# Patient Record
Sex: Male | Born: 1960 | Race: White | Hispanic: No | Marital: Married | Smoking: Never smoker
Health system: Southern US, Community
[De-identification: ages and names within clinical notes are randomized; demographics above are authoritative.]

## PROBLEM LIST (undated history)

## (undated) DIAGNOSIS — I10 Essential (primary) hypertension: Secondary | ICD-10-CM

## (undated) DIAGNOSIS — M25519 Pain in unspecified shoulder: Secondary | ICD-10-CM

## (undated) DIAGNOSIS — Z8241 Family history of sudden cardiac death: Secondary | ICD-10-CM

## (undated) DIAGNOSIS — E291 Testicular hypofunction: Secondary | ICD-10-CM

## (undated) DIAGNOSIS — M1990 Unspecified osteoarthritis, unspecified site: Secondary | ICD-10-CM

## (undated) DIAGNOSIS — M130 Polyarthritis, unspecified: Secondary | ICD-10-CM

## (undated) HISTORY — DX: Pain in unspecified shoulder: M25.519

## (undated) HISTORY — PX: HERNIA REPAIR: SHX51

## (undated) HISTORY — DX: Testicular hypofunction: E29.1

## (undated) HISTORY — DX: Unspecified osteoarthritis, unspecified site: M19.90

## (undated) HISTORY — DX: Polyarthritis, unspecified: M13.0

## (undated) HISTORY — DX: Family history of sudden cardiac death: Z82.41

---

## 1997-12-20 ENCOUNTER — Emergency Department (HOSPITAL_COMMUNITY): Admission: EM | Admit: 1997-12-20 | Discharge: 1997-12-20 | Payer: Self-pay | Admitting: Emergency Medicine

## 2011-11-05 ENCOUNTER — Other Ambulatory Visit: Payer: Self-pay | Admitting: Specialist

## 2011-11-16 ENCOUNTER — Encounter (HOSPITAL_COMMUNITY): Payer: Self-pay

## 2011-11-16 ENCOUNTER — Encounter (HOSPITAL_COMMUNITY)
Admission: RE | Admit: 2011-11-16 | Discharge: 2011-11-16 | Disposition: A | Discharge status: Home / Self Care | Payer: Commercial Managed Care - PPO | Source: Ambulatory Visit | Attending: Specialist | Admitting: Specialist

## 2011-11-16 ENCOUNTER — Ambulatory Visit (HOSPITAL_COMMUNITY)
Admission: RE | Admit: 2011-11-16 | Discharge: 2011-11-16 | Disposition: A | Discharge status: Home / Self Care | Payer: Commercial Managed Care - PPO | Source: Ambulatory Visit | Attending: Specialist | Admitting: Specialist

## 2011-11-16 DIAGNOSIS — J9819 Other pulmonary collapse: Secondary | ICD-10-CM | POA: Insufficient documentation

## 2011-11-16 DIAGNOSIS — Z01818 Encounter for other preprocedural examination: Secondary | ICD-10-CM | POA: Insufficient documentation

## 2011-11-16 HISTORY — DX: Essential (primary) hypertension: I10

## 2011-11-16 LAB — CBC
HCT: 39.6 % (ref 39.0–52.0)
Hemoglobin: 13.4 g/dL (ref 13.0–17.0)
MCV: 95.2 fL (ref 78.0–100.0)
Platelets: 289 10*3/uL (ref 150–400)
RBC: 4.16 MIL/uL — ABNORMAL LOW (ref 4.22–5.81)
WBC: 4.2 10*3/uL (ref 4.0–10.5)

## 2011-11-16 LAB — BASIC METABOLIC PANEL
CO2: 29 mEq/L (ref 19–32)
Calcium: 9.4 mg/dL (ref 8.4–10.5)
Chloride: 99 mEq/L (ref 96–112)
Glucose, Bld: 100 mg/dL — ABNORMAL HIGH (ref 70–99)
Potassium: 4.1 mEq/L (ref 3.5–5.1)
Sodium: 136 mEq/L (ref 135–145)

## 2011-11-16 LAB — SURGICAL PCR SCREEN: Staphylococcus aureus: NEGATIVE

## 2011-11-16 NOTE — Progress Notes (Signed)
10-18-2011 ekg [facility] on chart

## 2011-11-16 NOTE — Patient Instructions (Addendum)
20 Naguan Mazon  11/16/2011   Your procedure is scheduled on:  11-25-2011  Report to [facility] a 1030  AM.  Call this number if you have problems the morning of surgery: [phone]   Remember:   Do not eat food :After Midnight.  . Clear liquids midnight until 0700 am day of surgery, then nothing by mouth  Take these medicines the morning of surgery with A SIP OF WATER: allergy pilll, flonase nasal spray.   Do not wear jewelry or make up.  Do not wear lotions, powders, or perfumes. You may wear deodorant.    Do not bring valuables to the hospital.  Contacts, dentures or bridgework may not be worn into surgery.  Leave suitcase in the car. After surgery it may be brought to your room.  For patients admitted to the hospital, checkout time is 11:00 AM the day of discharge                             Patients discharged the day of surgery will not be allowed to drive home. If going home same day of surgery, you must have someone stay with you the first 24 hours at home and arrange for some one to drive you home from hospital.    Special Instructions: See [facility] Preparing for Surgery instruction sheet. Women do not shave legs or underarms for 12 hours before showers. Men may shave face morning of surgery.    Please read over the following fact sheets that you were given: MRSA Information  Cain [facility] pre op nurse phone number [phone], call if needed

## 2011-11-25 ENCOUNTER — Ambulatory Visit (HOSPITAL_COMMUNITY): Payer: 59 | Admitting: Anesthesiology

## 2011-11-25 ENCOUNTER — Encounter (HOSPITAL_COMMUNITY): Payer: Self-pay | Admitting: *Deleted

## 2011-11-25 ENCOUNTER — Encounter (HOSPITAL_COMMUNITY): Payer: Self-pay | Admitting: Anesthesiology

## 2011-11-25 ENCOUNTER — Ambulatory Visit (HOSPITAL_COMMUNITY)
Admission: RE | Admit: 2011-11-25 | Discharge: 2011-11-26 | Disposition: A | Discharge status: Home / Self Care | Payer: 59 | Source: Ambulatory Visit | Attending: Specialist | Admitting: Specialist

## 2011-11-25 ENCOUNTER — Encounter (HOSPITAL_COMMUNITY)
Admission: RE | Disposition: A | Discharge status: Home / Self Care | Payer: Self-pay | Source: Ambulatory Visit | Attending: Specialist

## 2011-11-25 DIAGNOSIS — S43429A Sprain of unspecified rotator cuff capsule, initial encounter: Secondary | ICD-10-CM | POA: Insufficient documentation

## 2011-11-25 DIAGNOSIS — Z79899 Other long term (current) drug therapy: Secondary | ICD-10-CM | POA: Insufficient documentation

## 2011-11-25 DIAGNOSIS — M75101 Unspecified rotator cuff tear or rupture of right shoulder, not specified as traumatic: Secondary | ICD-10-CM

## 2011-11-25 DIAGNOSIS — I10 Essential (primary) hypertension: Secondary | ICD-10-CM | POA: Insufficient documentation

## 2011-11-25 DIAGNOSIS — M24119 Other articular cartilage disorders, unspecified shoulder: Secondary | ICD-10-CM | POA: Insufficient documentation

## 2011-11-25 DIAGNOSIS — X58XXXA Exposure to other specified factors, initial encounter: Secondary | ICD-10-CM | POA: Insufficient documentation

## 2011-11-25 DIAGNOSIS — M25819 Other specified joint disorders, unspecified shoulder: Secondary | ICD-10-CM | POA: Insufficient documentation

## 2011-11-25 HISTORY — PX: SHOULDER ARTHROSCOPY WITH ROTATOR CUFF REPAIR AND SUBACROMIAL DECOMPRESSION: SHX5686

## 2011-11-25 HISTORY — DX: Unspecified rotator cuff tear or rupture of right shoulder, not specified as traumatic: M75.101

## 2011-11-25 SURGERY — SHOULDER ARTHROSCOPY WITH ROTATOR CUFF REPAIR AND SUBACROMIAL DECOMPRESSION
Anesthesia: General | Site: Shoulder | Laterality: Right | Wound class: Clean

## 2011-11-25 MED ORDER — GLYCOPYRROLATE 0.2 MG/ML IJ SOLN
INTRAMUSCULAR | Status: DC | PRN
  Administered 2011-11-25: 0.6 mg via INTRAVENOUS

## 2011-11-25 MED ORDER — LACTATED RINGERS IV SOLN: INTRAVENOUS | Status: DC

## 2011-11-25 MED ORDER — DEXAMETHASONE SODIUM PHOSPHATE 4 MG/ML IJ SOLN
INTRAMUSCULAR | Status: DC | PRN
  Administered 2011-11-25: 10 mg via INTRAVENOUS

## 2011-11-25 MED ORDER — ACETAMINOPHEN 10 MG/ML IV SOLN
INTRAVENOUS | Status: AC
  Filled 2011-11-25: qty 100

## 2011-11-25 MED ORDER — METOCLOPRAMIDE HCL 5 MG/ML IJ SOLN: 5.0000 mg | Freq: Three times a day (TID) | INTRAMUSCULAR | Status: DC | PRN

## 2011-11-25 MED ORDER — METHOCARBAMOL 500 MG PO TABS
500.0000 mg | ORAL_TABLET | Freq: Four times a day (QID) | ORAL | Status: DC | PRN
  Administered 2011-11-25: 500 mg via ORAL
  Filled 2011-11-25: qty 1

## 2011-11-25 MED ORDER — METOCLOPRAMIDE HCL 5 MG/ML IJ SOLN
INTRAMUSCULAR | Status: DC | PRN
  Administered 2011-11-25: 10 mg via INTRAVENOUS

## 2011-11-25 MED ORDER — KETOROLAC TROMETHAMINE 30 MG/ML IJ SOLN
INTRAMUSCULAR | Status: AC
  Administered 2011-11-25: 30 mg via INTRAVENOUS
  Filled 2011-11-25: qty 1

## 2011-11-25 MED ORDER — LACTATED RINGERS IR SOLN
Status: DC | PRN
  Administered 2011-11-25: 6000 mL

## 2011-11-25 MED ORDER — HYDROMORPHONE HCL PF 1 MG/ML IJ SOLN
0.2500 mg | INTRAMUSCULAR | Status: DC | PRN
  Administered 2011-11-25 (×4): 0.5 mg via INTRAVENOUS

## 2011-11-25 MED ORDER — KETAMINE HCL 10 MG/ML IJ SOLN
INTRAMUSCULAR | Status: DC | PRN
  Administered 2011-11-25 (×2): 1 mg via INTRAVENOUS
  Administered 2011-11-25: 45 mg via INTRAVENOUS
  Administered 2011-11-25 (×5): 1 mg via INTRAVENOUS

## 2011-11-25 MED ORDER — PHENOL 1.4 % MT LIQD: 1.0000 | OROMUCOSAL | Status: DC | PRN

## 2011-11-25 MED ORDER — DOCUSATE SODIUM 100 MG PO CAPS
100.0000 mg | ORAL_CAPSULE | Freq: Two times a day (BID) | ORAL | Status: DC
  Administered 2011-11-25: 100 mg via ORAL

## 2011-11-25 MED ORDER — EPINEPHRINE HCL 1 MG/ML IJ SOLN
INTRAMUSCULAR | Status: DC | PRN
  Administered 2011-11-25: 2 mg

## 2011-11-25 MED ORDER — HYDROMORPHONE HCL PF 1 MG/ML IJ SOLN
INTRAMUSCULAR | Status: AC
  Filled 2011-11-25: qty 1

## 2011-11-25 MED ORDER — NEOSTIGMINE METHYLSULFATE 1 MG/ML IJ SOLN
INTRAMUSCULAR | Status: DC | PRN
  Administered 2011-11-25: 4 mg via INTRAVENOUS

## 2011-11-25 MED ORDER — ACETAMINOPHEN 650 MG RE SUPP: 650.0000 mg | Freq: Four times a day (QID) | RECTAL | Status: DC | PRN

## 2011-11-25 MED ORDER — EPINEPHRINE HCL 1 MG/ML IJ SOLN
INTRAMUSCULAR | Status: AC
  Filled 2011-11-25: qty 2

## 2011-11-25 MED ORDER — SODIUM CHLORIDE 0.9 % IR SOLN
Status: DC | PRN
  Administered 2011-11-25: 15:00:00

## 2011-11-25 MED ORDER — BUPIVACAINE-EPINEPHRINE 0.5% -1:200000 IJ SOLN
INTRAMUSCULAR | Status: DC | PRN
  Administered 2011-11-25: 20 mL

## 2011-11-25 MED ORDER — ACETAMINOPHEN 325 MG PO TABS
650.0000 mg | ORAL_TABLET | Freq: Four times a day (QID) | ORAL | Status: DC | PRN
  Administered 2011-11-26: 650 mg via ORAL
  Filled 2011-11-25: qty 2

## 2011-11-25 MED ORDER — MIDAZOLAM HCL 5 MG/5ML IJ SOLN
INTRAMUSCULAR | Status: DC | PRN
  Administered 2011-11-25: 2 mg via INTRAVENOUS

## 2011-11-25 MED ORDER — ACETAMINOPHEN 10 MG/ML IV SOLN
INTRAVENOUS | Status: DC | PRN
  Administered 2011-11-25: 1000 mg via INTRAVENOUS

## 2011-11-25 MED ORDER — OXYCODONE-ACETAMINOPHEN 7.5-325 MG PO TABS: 1.0000 | ORAL_TABLET | ORAL | Status: DC | PRN

## 2011-11-25 MED ORDER — ONDANSETRON HCL 4 MG PO TABS: 4.0000 mg | ORAL_TABLET | Freq: Four times a day (QID) | ORAL | Status: DC | PRN

## 2011-11-25 MED ORDER — ONDANSETRON HCL 4 MG/2ML IJ SOLN: 4.0000 mg | Freq: Four times a day (QID) | INTRAMUSCULAR | Status: DC | PRN

## 2011-11-25 MED ORDER — CEFAZOLIN SODIUM-DEXTROSE 2-3 GM-% IV SOLR
INTRAVENOUS | Status: AC
  Filled 2011-11-25: qty 50

## 2011-11-25 MED ORDER — CEFAZOLIN SODIUM-DEXTROSE 2-3 GM-% IV SOLR
2.0000 g | INTRAVENOUS | Status: AC
  Administered 2011-11-25: 2 g via INTRAVENOUS

## 2011-11-25 MED ORDER — PROMETHAZINE HCL 25 MG/ML IJ SOLN: 6.2500 mg | INTRAMUSCULAR | Status: DC | PRN

## 2011-11-25 MED ORDER — PROMETHAZINE HCL 25 MG/ML IJ SOLN
INTRAMUSCULAR | Status: AC
  Filled 2011-11-25: qty 1

## 2011-11-25 MED ORDER — FENTANYL CITRATE 0.05 MG/ML IJ SOLN
INTRAMUSCULAR | Status: DC | PRN
  Administered 2011-11-25: 50 ug via INTRAVENOUS
  Administered 2011-11-25 (×2): 100 ug via INTRAVENOUS
  Administered 2011-11-25 (×3): 50 ug via INTRAVENOUS

## 2011-11-25 MED ORDER — KETOROLAC TROMETHAMINE 30 MG/ML IJ SOLN: 30.0000 mg | Freq: Once | INTRAMUSCULAR | Status: DC

## 2011-11-25 MED ORDER — ONDANSETRON HCL 4 MG/2ML IJ SOLN
INTRAMUSCULAR | Status: DC | PRN
  Administered 2011-11-25: 4 mg via INTRAVENOUS

## 2011-11-25 MED ORDER — ROCURONIUM BROMIDE 100 MG/10ML IV SOLN
INTRAVENOUS | Status: DC | PRN
  Administered 2011-11-25: 50 mg via INTRAVENOUS

## 2011-11-25 MED ORDER — MENTHOL 3 MG MT LOZG: 1.0000 | LOZENGE | OROMUCOSAL | Status: DC | PRN

## 2011-11-25 MED ORDER — METOCLOPRAMIDE HCL 10 MG PO TABS: 5.0000 mg | ORAL_TABLET | Freq: Three times a day (TID) | ORAL | Status: DC | PRN

## 2011-11-25 MED ORDER — SODIUM CHLORIDE 0.45 % IV SOLN
INTRAVENOUS | Status: DC
  Administered 2011-11-25: 19:00:00 via INTRAVENOUS

## 2011-11-25 MED ORDER — OXYCODONE-ACETAMINOPHEN 5-325 MG PO TABS
1.0000 | ORAL_TABLET | ORAL | Status: DC | PRN
  Administered 2011-11-25 (×2): 1 via ORAL
  Filled 2011-11-25: qty 2
  Filled 2011-11-25 (×2): qty 1

## 2011-11-25 MED ORDER — KETOROLAC TROMETHAMINE 10 MG PO TABS: 10.0000 mg | ORAL_TABLET | Freq: Four times a day (QID) | ORAL | Status: DC | PRN

## 2011-11-25 MED ORDER — CEPHALEXIN 500 MG PO CAPS: 500.0000 mg | ORAL_CAPSULE | Freq: Four times a day (QID) | ORAL | Status: DC

## 2011-11-25 MED ORDER — METHOCARBAMOL 500 MG PO TABS: 500.0000 mg | ORAL_TABLET | Freq: Three times a day (TID) | ORAL | Status: DC | PRN

## 2011-11-25 MED ORDER — METHOCARBAMOL 100 MG/ML IJ SOLN
500.0000 mg | Freq: Four times a day (QID) | INTRAMUSCULAR | Status: DC | PRN
  Administered 2011-11-25: 500 mg via INTRAVENOUS
  Filled 2011-11-25: qty 5

## 2011-11-25 MED ORDER — PROPOFOL 10 MG/ML IV BOLUS
INTRAVENOUS | Status: DC | PRN
  Administered 2011-11-25: 200 mg via INTRAVENOUS

## 2011-11-25 MED ORDER — PHENYLEPHRINE HCL 10 MG/ML IJ SOLN
INTRAMUSCULAR | Status: DC | PRN
  Administered 2011-11-25: 40 ug via INTRAVENOUS

## 2011-11-25 MED ORDER — HYDROMORPHONE HCL PF 1 MG/ML IJ SOLN: 0.5000 mg | INTRAMUSCULAR | Status: DC | PRN

## 2011-11-25 MED ORDER — BUPIVACAINE-EPINEPHRINE (PF) 0.5% -1:200000 IJ SOLN
INTRAMUSCULAR | Status: AC
  Filled 2011-11-25: qty 10

## 2011-11-25 MED ORDER — LACTATED RINGERS IV SOLN
INTRAVENOUS | Status: DC | PRN
  Administered 2011-11-25 (×2): via INTRAVENOUS

## 2011-11-25 SURGICAL SUPPLY — 40 items
ANCH SUT 2 CP-2 ROTR CUF (Anchor) ×1 IMPLANT
ANCH SUT PUSHLCK 24X4.5 STRL (Orthopedic Implant) ×1 IMPLANT
ANCHOR ROTATOR CUFF #2 (Anchor) ×2 IMPLANT
BLADE CUTTER GATOR 3.5 (BLADE) ×2 IMPLANT
BLADE SURG SZ11 CARB STEEL (BLADE) ×2 IMPLANT
BUR OVAL 4.0 (BURR) ×2 IMPLANT
CANNULA ACUFO 5X76 (CANNULA) ×1 IMPLANT
CHLORAPREP W/TINT 26ML (MISCELLANEOUS) IMPLANT
CLOTH BEACON ORANGE TIMEOUT ST (SAFETY) ×2 IMPLANT
DECANTER SPIKE VIAL GLASS SM (MISCELLANEOUS) ×2 IMPLANT
DRAPE ORTHO SPLIT 77X108 STRL (DRAPES) ×2
DRAPE SURG ORHT 6 SPLT 77X108 (DRAPES) IMPLANT
DRAPE U-SHAPE 47X51 STRL (DRAPES) ×2 IMPLANT
DRSG EMULSION OIL 3X3 NADH (GAUZE/BANDAGES/DRESSINGS) ×1 IMPLANT
DURAPREP 26ML APPLICATOR (WOUND CARE) ×2 IMPLANT
GLOVE BIO SURGEON STRL SZ 6.5 (GLOVE) ×2 IMPLANT
GLOVE BIOGEL PI IND STRL 8 (GLOVE) ×1 IMPLANT
GLOVE BIOGEL PI INDICATOR 8 (GLOVE) ×1
GLOVE ECLIPSE 6.5 STRL STRAW (GLOVE) ×2 IMPLANT
GLOVE SURG SS PI 8.0 STRL IVOR (GLOVE) ×4 IMPLANT
GOWN PREVENTION PLUS LG XLONG (DISPOSABLE) ×1 IMPLANT
GOWN PREVENTION PLUS XLARGE (GOWN DISPOSABLE) ×4 IMPLANT
GOWN STRL REIN XL XLG (GOWN DISPOSABLE) ×1 IMPLANT
KIT BASIN OR (CUSTOM PROCEDURE TRAY) ×2 IMPLANT
MANIFOLD NEPTUNE II (INSTRUMENTS) ×3 IMPLANT
NDL SPNL 18GX3.5 QUINCKE PK (NEEDLE) ×1 IMPLANT
NEEDLE SPNL 18GX3.5 QUINCKE PK (NEEDLE) ×2 IMPLANT
PACK SHOULDER CUSTOM OPM052 (CUSTOM PROCEDURE TRAY) ×2 IMPLANT
POSITIONER SURGICAL ARM (MISCELLANEOUS) ×1 IMPLANT
PUSHLOCK PEEK 4.5X24 (Orthopedic Implant) ×2 IMPLANT
SET ARTHROSCOPY TUBING (MISCELLANEOUS) ×2
SET ARTHROSCOPY TUBING LN (MISCELLANEOUS) ×1 IMPLANT
SLING ARM IMMOBILIZER LRG (SOFTGOODS) ×1 IMPLANT
SLING ARM IMMOBILIZER MED (SOFTGOODS) ×1 IMPLANT
STRAP CHIN BCCS-OSI (MISCELLANEOUS) ×2 IMPLANT
SUT ETHILON 4 0 PS 2 18 (SUTURE) ×1 IMPLANT
SUT VIC AB 2-0 CT2 27 (SUTURE) ×1 IMPLANT
SUT VICRYL 0-0 OS 2 NEEDLE (SUTURE) ×2 IMPLANT
TUBING CONNECTING 10 (TUBING) ×2 IMPLANT
WAND 90 DEG TURBOVAC W/CORD (SURGICAL WAND) ×1 IMPLANT

## 2011-11-25 NOTE — Anesthesia Postprocedure Evaluation (Deleted)
  Anesthesia Post-op Note  Patient: Dustin Mclean  Procedure(s) Performed: Procedure(s) (LRB): SHOULDER ARTHROSCOPY WITH ROTATOR CUFF REPAIR AND SUBACROMIAL DECOMPRESSION (Right)  Patient Location: PACU  Anesthesia Type: General  Level of Consciousness: awake and alert   Airway and Oxygen Therapy: Patient Spontanous Breathing  Post-op Pain: mild  Post-op Assessment: Post-op Vital signs reviewed, Patient's Cardiovascular Status Stable, Respiratory Function Stable, Patent Airway and No signs of Nausea or vomiting  Post-op Vital Signs: stable  Complications: No apparent anesthesia complications

## 2011-11-25 NOTE — Transfer of Care (Signed)
Immediate Anesthesia Transfer of Care Note  Patient: Dustin Mclean  Procedure(s) Performed: Procedure(s) (LRB) with comments: SHOULDER ARTHROSCOPY WITH ROTATOR CUFF REPAIR AND SUBACROMIAL DECOMPRESSION (Right) - RIGHT SHOULDER ARTHROSCOPY WITH ROTATOR CUFF REPAIR/SUBACROMIAL DECOMPRESSION WITH MINI OPEN ROTATOR CUFF REPAIR  Patient Location: PACU  Anesthesia Type:General  Level of Consciousness: awake, alert , patient cooperative and responds to stimulation  Airway & Oxygen Therapy: Patient Spontanous Breathing and Patient connected to face mask oxygen  Post-op Assessment: Report given to PACU RN, Post -op Vital signs reviewed and stable and Patient moving all extremities X 4  Post vital signs: Reviewed and stable  Complications: No apparent anesthesia complications

## 2011-11-25 NOTE — Brief Op Note (Signed)
11/25/2011  3:02 PM  PATIENT:  Dustin Mclean  51 y.o. male  PRE-OPERATIVE DIAGNOSIS:  RIGHT SHOULDER ROTATOR CUFF TEAR  POST-OPERATIVE DIAGNOSIS:  repair right rotator cuff  PROCEDURE:  Procedure(s) (LRB) with comments: SHOULDER ARTHROSCOPY WITH ROTATOR CUFF REPAIR AND SUBACROMIAL DECOMPRESSION (Right) - RIGHT SHOULDER ARTHROSCOPY WITH ROTATOR CUFF REPAIR/SUBACROMIAL DECOMPRESSION WITH MINI OPEN ROTATOR CUFF REPAIR  SURGEON:  Surgeon(s) and Role:    * Javier Docker, MD - Primary  PHYSICIAN ASSISTANT:   ASSISTANTS: Irish Elders   ANESTHESIA:   general  EBL:  Total I/O In: 1000 [I.V.:1000] Out: -   BLOOD ADMINISTERED:none  DRAINS: none   LOCAL MEDICATIONS USED:  MARCAINE     SPECIMEN:  No Specimen  DISPOSITION OF SPECIMEN:  N/A  COUNTS:  YES  TOURNIQUET:  * No tourniquets in log *  DICTATION: .Other Dictation: Dictation Number     [id number]  PLAN OF CARE: Discharge to home after PACU  PATIENT DISPOSITION:  PACU - hemodynamically stable.   Delay start of Pharmacological VTE agent (>24hrs) due to surgical blood loss or risk of bleeding: no

## 2011-11-25 NOTE — Anesthesia Postprocedure Evaluation (Signed)
  Anesthesia Post-op Note  Patient: Dustin Mclean  Procedure(s) Performed: Procedure(s) (LRB): SHOULDER ARTHROSCOPY WITH ROTATOR CUFF REPAIR AND SUBACROMIAL DECOMPRESSION (Right)  Patient Location: PACU  Anesthesia Type: General  Level of Consciousness: awake and alert   Airway and Oxygen Therapy: Patient Spontanous Breathing  Post-op Pain: mild  Post-op Assessment: Post-op Vital signs reviewed, Patient's Cardiovascular Status Stable, Respiratory Function Stable, Patent Airway and No signs of Nausea or vomiting  Post-op Vital Signs: stable  Complications: No apparent anesthesia complications. To be admitted.

## 2011-11-25 NOTE — H&P (Signed)
Dustin Mclean is an 51 y.o. male.   Chief Complaint: right RC tear HPI: Tear RC right  Past Medical History  Diagnosis Date  . Hypertension     Past Surgical History  Procedure Date  . Hernia repair 2006 and yrs ago    x 2    History reviewed. No pertinent family history. Social History:  reports that he has never smoked. He has never used smokeless tobacco. He reports that he does not drink alcohol or use illicit drugs.  Allergies:  Allergies  Allergen Reactions  . Caffeine Other (See Comments)    Loose bowel movements    Medications Prior to Admission  Medication Sig Dispense Refill  . cetirizine (ZYRTEC) 10 MG tablet Take 10 mg by mouth every morning.       . diclofenac (VOLTAREN) 75 MG EC tablet Take 75 mg by mouth 2 (two) times daily as needed. Pain      . fluticasone (FLONASE) 50 MCG/ACT nasal spray Place 2 sprays into the nose every morning.       Marland Kitchen lisinopril (PRINIVIL,ZESTRIL) 10 MG tablet Take 15 mg by mouth every morning. Pt takes 1.5 tab for 15 mg dose      . Multiple Vitamins-Minerals (MULTIVITAMIN WITH MINERALS) tablet Take 1 tablet by mouth daily.      . Omega-3 Fatty Acids (FISH OIL) 1000 MG CAPS Take 1,000 mg by mouth 2 (two) times daily.        No results found for this or any previous visit (from the past 48 hour(s)). No results found.  Review of Systems  Musculoskeletal: Positive for joint pain.  All other systems reviewed and are negative.    Blood pressure 150/93, pulse 74, temperature 98.4 F (36.9 C), temperature source Oral, resp. rate 18, SpO2 97.00%. Physical Exam  Vitals reviewed. Constitutional: He is oriented to person, place, and time. He appears well-developed.  HENT:  Head: Normocephalic.  Eyes: Pupils are equal, round, and reactive to light.  Neck: Normal range of motion.  Cardiovascular: Normal rate.   Respiratory: Effort normal.  GI: Soft.  Musculoskeletal:       +impingement right. Weak ER.  Neurological: He is alert and  oriented to person, place, and time.  Skin: Skin is warm and dry.  Psychiatric: He has a normal mood and affect.   MRI tear right RC  Assessment/Plan Right RC tear. Plan Right SA SAD RCR. Risks discussed  Joven Mom C 11/25/2011, 12:59 PM

## 2011-11-25 NOTE — Preoperative (Signed)
Beta Blockers   Reason not to administer Beta Blockers:Not Applicable, not on home BB 

## 2011-11-25 NOTE — Anesthesia Preprocedure Evaluation (Addendum)
Anesthesia Evaluation  Patient identified by MRN, date of birth, ID band Patient awake  General Assessment Comment:H/O difficulty breathing after surgery in 2006 in PACU, but this resolved.  Reviewed: Allergy & Precautions, H&P , NPO status , Patient's Chart, lab work & pertinent test results, reviewed documented beta blocker date and time   Airway Mallampati: II TM Distance: >3 FB Neck ROM: full    Dental No notable dental hx.    Pulmonary neg pulmonary ROS,  breath sounds clear to auscultation  Pulmonary exam normal       Cardiovascular Exercise Tolerance: Good hypertension, On Medications Rhythm:regular Rate:Normal     Neuro/Psych negative neurological ROS  negative psych ROS   GI/Hepatic negative GI ROS, Neg liver ROS,   Endo/Other  negative endocrine ROS  Renal/GU negative Renal ROS  negative genitourinary   Musculoskeletal   Abdominal (+) + obese,   Peds  Hematology negative hematology ROS (+)   Anesthesia Other Findings   Reproductive/Obstetrics negative OB ROS                          Anesthesia Physical Anesthesia Plan  ASA: II  Anesthesia Plan: General ETT   Post-op Pain Management:    Induction:   Airway Management Planned:   Additional Equipment:   Intra-op Plan:   Post-operative Plan:   Informed Consent: I have reviewed the patients History and Physical, chart, labs and discussed the procedure including the risks, benefits and alternatives for the proposed anesthesia with the patient or authorized representative who has indicated his/her understanding and acceptance.   Dental Advisory Given  Plan Discussed with: CRNA  Anesthesia Plan Comments:         Anesthesia Quick Evaluation

## 2011-11-25 NOTE — Anesthesia Procedure Notes (Signed)
Procedure Name: Intubation Date/Time: 11/25/2011 1:26 PM Performed by: Randon Goldsmith CATHERINE PAYNE Pre-anesthesia Checklist: Patient identified, Emergency Drugs available, Suction available and Patient being monitored Patient Re-evaluated:Patient Re-evaluated prior to inductionOxygen Delivery Method: Circle system utilized Preoxygenation: Pre-oxygenation with 100% oxygen Intubation Type: IV induction Ventilation: Mask ventilation without difficulty Grade View: Grade I Tube type: Oral Tube size: 7.5 mm Number of attempts: 2 Airway Equipment and Method: Video-laryngoscopy Placement Confirmation: ETT inserted through vocal cords under direct vision,  positive ETCO2 and breath sounds checked- equal and bilateral Secured at: 24 cm Tube secured with: Tape Dental Injury: Injury to tongue, Bloody posterior oropharynx and Injury to lip  Difficulty Due To: Difficulty was unanticipated, Difficult Airway- due to limited oral opening and Difficult Airway- due to reduced neck mobility Comments: DVL x1 with mac 4, only view epiglottis, unable to pass ETT through cords, DVL x1 with glidescope, cords in view, ETT passed trhough cords under direct visualization. Weston Brass noted to upper left side of lip and nick noted to left side of tongue , and blood noted in the posterior oropharynx, Dr. Council Mechanic aware

## 2011-11-26 NOTE — Progress Notes (Signed)
Discharged from floor via w/c, spouse with pt. No changes in assessment. [facility]   

## 2011-11-26 NOTE — Op Note (Signed)
NAMETRISTIN, VANDEUSEN NO.:  [id number]  MEDICAL RECORD NO.:  [id number]  LOCATION:  1612                         FACILITY:  [facility]  PHYSICIAN:  Jene Every, M.D.    DATE OF BIRTH:  04-01-1960  DATE OF PROCEDURE: DATE OF DISCHARGE:                              OPERATIVE REPORT   PREOPERATIVE DIAGNOSIS:  Rotator cuff tear impingement syndrome, right shoulder.  POSTOPERATIVE DIAGNOSIS:  Rotator cuff tear impingement syndrome, right shoulder, glenoid labral tear.  PROCEDURE PERFORMED: 1. Right shoulder arthroscopy. 2. Debridement of anterior and superior labral tear. 3. Subacromial decompression acromioplasty. 4. Mini open rotator cuff repair.  ANESTHESIA:  General.  ASSISTANT:  Lanna Poche, PA  HISTORY:  A 51 year old fell with rotator cuff tear by MRI, impingement syndrome, possible labral pathology indicated for repair due to persistent symptoms.  Also, has impingement pain despite conservative treatment.  There is a full-thickness tear, is indicated for repair. Risks and benefits discussed including bleeding, infection, DVT, PE, anesthetic complications, etc.  TECHNIQUE:  The patient in supine beach-chair position after induction of adequate general anesthesia, 2 g Kefzol.  Exam under anesthesia revealed full range, right shoulder and upper extremity.  It was prepped and draped in the usual sterile fashion.  Surgical marker utilized on the acromion, AC joint, and coracoid.  Standard posterolateral and anterolateral portals were utilized with incision through the skin only. With the arm in 7:30 position with gentle traction applied, we advanced the arthroscopic camera in the glenohumeral joint in line with coracoid penetrating atraumatically.  Irrigant was utilized to insufflate the joint, 65 mmHg were utilized.  There was extensive tearing of the anterior superior labrum.  The glenoid and humerus was unremarkable. Biceps tendon was in its  groove.  Introduced an 18-gauge needle, was utilized to localize the anterior portal just beneath the biceps tendon, made a small incision, advanced the cannula and penetrating the capsule just beneath the biceps tendon.  Introducer shaver debrided the labrum to a stable base.  There was no detachment from the glenoid.  The joint was lavaged as well.  This was then removed.  Further inspection revealed, subscap was unremarkable.  Small area of tearing was noted just lateral to the bicipital groove and the supraspinatus.  I redirected the camera in the subacromial space and fashioned the anterolateral portal.  The skin only with a #11 blade.  Ingress cannula triangulated in the subacromial space.  Exuberant bursa was noted.  This was shaved, released the CA ligament.  A small tear was noted.  We had issues with the arthroscopic camera with suboptimal visualization. Therefore, converted to a suboptimal visualization.  Next, we converted to a mini open repair.  We removed all arthroscopic and cannula was closed with the portals with 4-0 nylon simple sutures.  I made a small 2- cm incision over the anterolateral aspect of the acromion.  Subcutaneous tissue was dissected.  Electrocautery was utilized to achieve hemostasis.  Raphe between the anterolateral heads was identified, developed, divided in line with skin incision.  Using an AO elevator into the subacromial space, anterolateral and anteromedial, preserving the deltoid attachment.  Removing the remainder of the CA ligament. Small spur  over the anterolateral aspect the acromion, and anteromedial aspect of the acromion was removed with a 3-mm Kerrison.  I digitally lysed adhesions in the subacromial space.  Small tear was noted.  Full thickness was only 3 mm and 4 mm in length.  Debrided the edges and repaired it side-to-side with 1-0 Vicryl interrupted figure-of-eight sutures.  Remainder of the cuff was unremarkable.  Therefore,  we copiously irrigated the wound.  We repaired the raphe with 1 Vicryl interrupted figure-of-eight sutures, subcu with 2-0 Vicryl simple sutures.  Skin was reapproximated 4-0 subcuticular Prolene.  Wound reinforced with Steri-Strips.  Sterile dressing applied.  Placed in a sling, extubated without difficulty, and transported to the recovery room in satisfactory condition.  The patient tolerated the procedure well.  No complications.  Assistant was Lanna Poche, [place].  Minimal blood loss.     Jene Every, M.D.     Cordelia Pen  D:  11/25/2011  T:  11/26/2011  Job:  [id number]

## 2011-11-26 NOTE — Progress Notes (Signed)
Orthopedics Progress Note  Subjective: Pt doing well Minimal pain to right shoulder today Ready for d/c  Objective:  Filed Vitals:   11/26/11 0540  BP: 122/72  Pulse: 69  Temp: 98.7 F (37.1 C)  Resp: 18    General: Awake and alert  Musculoskeletal: right shoulder dressing and sling intact, nv intact distally Neurovascularly intact  Lab Results  Component Value Date   WBC 4.2 11/16/2011   HGB 13.4 11/16/2011   HCT 39.6 11/16/2011   MCV 95.2 11/16/2011   PLT 289 11/16/2011       Component Value Date/Time   NA 136 11/16/2011 1400   K 4.1 11/16/2011 1400   CL 99 11/16/2011 1400   CO2 29 11/16/2011 1400   GLUCOSE 100* 11/16/2011 1400   BUN 16 11/16/2011 1400   CREATININE 0.83 11/16/2011 1400   CALCIUM 9.4 11/16/2011 1400   GFRNONAA >90 11/16/2011 1400   GFRAA >90 11/16/2011 1400    No results found for this basename: INR, PROTIME    Assessment/Plan: POD #1 s/p Procedure(s):right  SHOULDER ARTHROSCOPY WITH ROTATOR CUFF REPAIR AND SUBACROMIAL DECOMPRESSION  D/c home  F/u in 2 weeks  Almedia Balls. Ranell Patrick, MD 11/26/2011 7:46 AM

## 2011-11-28 ENCOUNTER — Encounter (HOSPITAL_COMMUNITY): Payer: Self-pay | Admitting: Specialist

## 2011-11-30 NOTE — Discharge Summary (Signed)
Physician Discharge Summary  Patient ID: Dustin Mclean MRN: [id number] DOB/AGE: 11-Aug-1960 51 y.o.  Admit date: 11/25/2011 Discharge date: 11/30/2011  Admission Diagnoses: rotator cuf tear right  Discharge Diagnoses:  Principal Problem:  *Rotator cuff tear, right   Discharged Condition: good  Hospital Course: tolerated well. No complications  Consults: None  Significant Diagnostic Studies: none  Treatments: surgery: RC repair. Tolerated well.   Discharge Exam: Blood pressure 122/72, pulse 69, temperature 98.7 F (37.1 C), temperature source Oral, resp. rate 18, height 5\' 10"  (1.778 m), weight 106.595 kg (235 lb), SpO2 94.00%. See notes  Disposition: 01-Home or Self Care  Discharge Orders    Future Orders Please Complete By Expires   Diet - low sodium heart healthy      Diet - low sodium heart healthy      Call MD / Call 911      Comments:   If you experience chest pain or shortness of breath, CALL 911 and be transported to the hospital emergency room.  If you develope a fever above 101 F, pus (white drainage) or increased drainage or redness at the wound, or calf pain, call your surgeon's office.   Constipation Prevention      Comments:   Drink plenty of fluids.  Prune juice may be helpful.  You may use a stool softener, such as Colace (over the counter) 100 mg twice a day.  Use MiraLax (over the counter) for constipation as needed.   Increase activity slowly as tolerated      Call MD / Call 911      Comments:   If you experience chest pain or shortness of breath, CALL 911 and be transported to the hospital emergency room.  If you develope a fever above 101 F, pus (white drainage) or increased drainage or redness at the wound, or calf pain, call your surgeon's office.   Constipation Prevention      Comments:   Drink plenty of fluids.  Prune juice may be helpful.  You may use a stool softener, such as Colace (over the counter) 100 mg twice a day.  Use MiraLax (over the  counter) for constipation as needed.   Increase activity slowly as tolerated          Medication List     As of 11/30/2011  8:40 AM    STOP taking these medications         diclofenac 75 MG EC tablet   Commonly known as: VOLTAREN      TAKE these medications         cephALEXin 500 MG capsule   Commonly known as: KEFLEX   Take 1 capsule (500 mg total) by mouth 4 (four) times daily.      cetirizine 10 MG tablet   Commonly known as: ZYRTEC   Take 10 mg by mouth every morning.      Fish Oil 1000 MG Caps   Take 1,000 mg by mouth 2 (two) times daily.      fluticasone 50 MCG/ACT nasal spray   Commonly known as: FLONASE   Place 2 sprays into the nose every morning.      ketorolac 10 MG tablet   Commonly known as: TORADOL   Take 1 tablet (10 mg total) by mouth every 6 (six) hours as needed for pain.      lisinopril 10 MG tablet   Commonly known as: PRINIVIL,ZESTRIL   Take 15 mg by mouth every morning. Pt takes 1.5 tab for  15 mg dose      methocarbamol 500 MG tablet   Commonly known as: ROBAXIN   Take 1 tablet (500 mg total) by mouth 3 (three) times daily between meals as needed.      multivitamin with minerals tablet   Take 1 tablet by mouth daily.      oxyCODONE-acetaminophen 7.5-325 MG per tablet   Commonly known as: PERCOCET   Take 1-2 tablets by mouth every 4 (four) hours as needed for pain.           Follow-up Information    Follow up with Tippi Mccrae C, MD. In 2 weeks.   Contact information:   [address] [place] [address] [phone]          Signed: Javier Docker 11/30/2011, 8:40 AM

## 2013-05-10 HISTORY — PX: COLONOSCOPY: SHX174

## 2019-11-25 DIAGNOSIS — I10 Essential (primary) hypertension: Secondary | ICD-10-CM | POA: Insufficient documentation

## 2019-11-25 DIAGNOSIS — Z8241 Family history of sudden cardiac death: Secondary | ICD-10-CM | POA: Insufficient documentation

## 2019-11-25 DIAGNOSIS — M1990 Unspecified osteoarthritis, unspecified site: Secondary | ICD-10-CM | POA: Insufficient documentation

## 2019-11-25 DIAGNOSIS — M130 Polyarthritis, unspecified: Secondary | ICD-10-CM | POA: Insufficient documentation

## 2019-11-25 DIAGNOSIS — M25519 Pain in unspecified shoulder: Secondary | ICD-10-CM | POA: Insufficient documentation

## 2019-11-25 DIAGNOSIS — E291 Testicular hypofunction: Secondary | ICD-10-CM | POA: Insufficient documentation

## 2019-11-25 DIAGNOSIS — Z8249 Family history of ischemic heart disease and other diseases of the circulatory system: Secondary | ICD-10-CM | POA: Insufficient documentation

## 2019-11-26 ENCOUNTER — Encounter: Payer: Self-pay | Admitting: Cardiology

## 2019-11-26 ENCOUNTER — Other Ambulatory Visit: Payer: Self-pay

## 2019-11-26 ENCOUNTER — Ambulatory Visit (INDEPENDENT_AMBULATORY_CARE_PROVIDER_SITE_OTHER): Payer: 59 | Admitting: Cardiology

## 2019-11-26 VITALS — BP 122/88 | HR 71 | Ht 70.0 in | Wt 215.0 lb

## 2019-11-26 DIAGNOSIS — I10 Essential (primary) hypertension: Secondary | ICD-10-CM | POA: Diagnosis not present

## 2019-11-26 DIAGNOSIS — Z8249 Family history of ischemic heart disease and other diseases of the circulatory system: Secondary | ICD-10-CM | POA: Diagnosis not present

## 2019-11-26 DIAGNOSIS — R0789 Other chest pain: Secondary | ICD-10-CM | POA: Diagnosis not present

## 2019-11-26 MED ORDER — METOPROLOL TARTRATE 100 MG PO TABS: ORAL_TABLET | ORAL | 0 refills | Status: DC

## 2019-11-26 NOTE — Progress Notes (Signed)
Cardiology Office Note:    Date:  11/27/2019   ID:  Dustin Mclean, DOB 02/18/1960, MRN [id number]  PCP:  Imagene Riches, NP  Cardiologist:  Berniece Salines, DO  Electrophysiologist:  None   Referring MD: Imagene Riches, NP   " I have been having chest pain"  History of Present Illness:    Dustin Mclean is a 59 y.o. male with a hx of hypertension, family history of premature coronary artery disease, hyperlipidemia is here today to establish cardiac care.  The patient tells me that he had been experiencing significant intermittent chest pain.  He notes that his midsternal is a discomfort pressure-like sensation.  He also tells me that he feels as if the pain starts in his chest and the pressure pushes out towards his back area.  He is concerned about this he has not had any shortness of breath.  But noting that his brother died at age 8 and his father first MI was in his 36s.  Past Medical History:  Diagnosis Date  . Arthritis   . Family history of sudden cardiac death (SCD)   . Hypertension   . Pain in unspecified shoulder   . Polyarthritis, unspecified   . Testicular hypofunction     Past Surgical History:  Procedure Laterality Date  . COLONOSCOPY  05/10/2013  . HERNIA REPAIR  2006 and yrs ago   x 2  . SHOULDER ARTHROSCOPY WITH ROTATOR CUFF REPAIR AND SUBACROMIAL DECOMPRESSION  11/25/2011   Procedure: SHOULDER ARTHROSCOPY WITH ROTATOR CUFF REPAIR AND SUBACROMIAL DECOMPRESSION;  Surgeon: Johnn Hai, MD;  Location: [facility];  Service: Orthopedics;  Laterality: Right;  RIGHT SHOULDER ARTHROSCOPY WITH ROTATOR CUFF REPAIR/SUBACROMIAL DECOMPRESSION WITH MINI OPEN ROTATOR CUFF REPAIR    Current Medications: Current Meds  Medication Sig  . diclofenac (VOLTAREN) 75 MG EC tablet Take 75 mg by mouth in the morning and at bedtime.  . fluticasone (FLONASE) 50 MCG/ACT nasal spray Place 2 sprays into the nose every morning.   Marland Kitchen lisinopril (ZESTRIL) 40 MG tablet Take 40 mg by mouth every  morning. Pt takes 1.5 tab for 15 mg dose  . Multiple Vitamins-Minerals (MULTIVITAMIN WITH MINERALS) tablet Take 1 tablet by mouth daily.  . Omega-3 Fatty Acids (FISH OIL) 1000 MG CAPS Take 1,000 mg by mouth 2 (two) times daily.  . sildenafil (REVATIO) 20 MG tablet Take 20 mg by mouth 3 (three) times daily as needed.     Allergies:   Caffeine   Social History   Socioeconomic History  . Marital status: Married    Spouse name: Not on file  . Number of children: Not on file  . Years of education: Not on file  . Highest education level: Not on file  Occupational History  . Not on file  Tobacco Use  . Smoking status: Never Smoker  . Smokeless tobacco: Never Used  Substance and Sexual Activity  . Alcohol use: No  . Drug use: No  . Sexual activity: Not on file  Other Topics Concern  . Not on file  Social History Narrative  . Not on file   Social Determinants of Health   Financial Resource Strain:   . Difficulty of Paying Living Expenses: Not on file  Food Insecurity:   . Worried About Charity fundraiser in the Last Year: Not on file  . Ran Out of Food in the Last Year: Not on file  Transportation Needs:   . Lack of Transportation (Medical): Not on file  .  Lack of Transportation (Non-Medical): Not on file  Physical Activity:   . Days of Exercise per Week: Not on file  . Minutes of Exercise per Session: Not on file  Stress:   . Feeling of Stress : Not on file  Social Connections:   . Frequency of Communication with Friends and Family: Not on file  . Frequency of Social Gatherings with Friends and Family: Not on file  . Attends Religious Services: Not on file  . Active Member of Clubs or Organizations: Not on file  . Attends Archivist Meetings: Not on file  . Marital Status: Not on file     Family History: The patient's family history includes COPD in his father; Cancer in his maternal grandfather; Glaucoma in his mother; Heart disease in his father; High  Cholesterol in his father; High blood pressure in his father, maternal grandmother, and mother.  ROS:   Review of Systems  Constitution: Negative for decreased appetite, fever and weight gain.  HENT: Negative for congestion, ear discharge, hoarse voice and sore throat.   Eyes: Negative for discharge, redness, vision loss in right eye and visual halos.  Cardiovascular: Negative for chest pain, dyspnea on exertion, leg swelling, orthopnea and palpitations.  Respiratory: Negative for cough, hemoptysis, shortness of breath and snoring.   Endocrine: Negative for heat intolerance and polyphagia.  Hematologic/Lymphatic: Negative for bleeding problem. Does not bruise/bleed easily.  Skin: Negative for flushing, nail changes, rash and suspicious lesions.  Musculoskeletal: Negative for arthritis, joint pain, muscle cramps, myalgias, neck pain and stiffness.  Gastrointestinal: Negative for abdominal pain, bowel incontinence, diarrhea and excessive appetite.  Genitourinary: Negative for decreased libido, genital sores and incomplete emptying.  Neurological: Negative for brief paralysis, focal weakness, headaches and loss of balance.  Psychiatric/Behavioral: Negative for altered mental status, depression and suicidal ideas.  Allergic/Immunologic: Negative for HIV exposure and persistent infections.    EKGs/Labs/Other Studies Reviewed:    The following studies were reviewed today:   EKG:  The ekg ordered today demonstrates sinus rhythm, heart rate 71 bpm.  Recent Labs: No results found for requested labs within last 8760 hours.  Recent Lipid Panel No results found for: CHOL, TRIG, HDL, CHOLHDL, VLDL, LDLCALC, LDLDIRECT  Physical Exam:    VS:  BP 122/88 (BP Location: Right Arm)   Pulse 71   Ht $R'5\' 10"'Aq$  (1.778 m)   Wt 215 lb (97.5 kg)   SpO2 94%   BMI 30.85 kg/m     Wt Readings from Last 3 Encounters:  11/26/19 215 lb (97.5 kg)  11/25/11 235 lb (106.6 kg)  11/16/11 235 lb (106.6 kg)      GEN: Well nourished, well developed in no acute distress HEENT: Normal NECK: No JVD; No carotid bruits LYMPHATICS: No lymphadenopathy CARDIAC: S1S2 noted,RRR, no murmurs, rubs, gallops RESPIRATORY:  Clear to auscultation without rales, wheezing or rhonchi  ABDOMEN: Soft, non-tender, non-distended, +bowel sounds, no guarding. EXTREMITIES: No edema, No cyanosis, no clubbing MUSCULOSKELETAL:  No deformity  SKIN: Warm and dry NEUROLOGIC:  Alert and oriented x 3, non-focal PSYCHIATRIC:  Normal affect, good insight  ASSESSMENT:    1. Chest pressure   2. Primary hypertension   3. Family history of premature CAD    PLAN:       His chest pain is concerning his ASCVD score is 8.2%.  He has significant risk factors including hypertension hyperlipidemia and premature family history.  We need to pursue an ischemic evaluation in this patient.  We have discussed multiple  testing modalities shared decision a cardiac CTA will be appropriate at this time.  I educated patient about the testing.  He is agreeable to proceed with this test. He has had lipid panel done which showed LDL 124 he preferred to continue on his diet modification for now.  We will plan to repeat his lipid profile and make recommendations as appropriate. His blood pressures at target no changes will be made continue his lisinopril which was previously started by his PCP. The patient is in agreement with the above plan. The patient left the office in stable condition.  The patient will follow up in   Medication Adjustments/Labs and Tests Ordered: Current medicines are reviewed at length with the patient today.  Concerns regarding medicines are outlined above.  Orders Placed This Encounter  Procedures  . EKG 12-Lead   Meds ordered this encounter  Medications  . metoprolol tartrate (LOPRESSOR) 100 MG tablet    Sig: Take one tablet once 2 hours before ct.    Dispense:  1 tablet    Refill:  0    Patient Instructions   Medication Instructions:  Your physician recommends that you continue on your current medications as directed. Please refer to the Current Medication list given to you today.  *If you need a refill on your cardiac medications before your next appointment, please call your pharmacy*   Lab Work: None. If you have labs (blood work) drawn today and your tests are completely normal, you will receive your results only by: Marland Kitchen MyChart Message (if you have MyChart) OR . A paper copy in the mail If you have any lab test that is abnormal or we need to change your treatment, we will call you to review the results.   Testing/Procedures: Your cardiac CT will be scheduled at one of the below locations:   [facility] [address] [place], [place] [address] [phone]  [facility] [address] [place], [place] [address] [phone]  If scheduled at [facility], please arrive at the [facility] main entrance of [facility] 30 minutes prior to test start time. Proceed to the [facility] Radiology Department (first floor) to check-in and test prep.  If scheduled at [facility], please arrive 15 mins early for check-in and test prep.  Please follow these instructions carefully (unless otherwise directed):  Hold all erectile dysfunction medications at least 3 days (72 hrs) prior to test.  On the Night Before the Test: . Be sure to Drink plenty of water. . Do not consume any caffeinated/decaffeinated beverages or chocolate 12 hours prior to your test. . Do not take any antihistamines 12 hours prior to your test.   On the Day of the Test: . Drink plenty of water. Do not drink any water within one hour of the test. . Do not eat any food 4 hours prior to the test. . You may take your regular medications prior to the test.  . Take metoprolol (Lopressor) two hours prior to  test.          After the Test: . Drink plenty of water. . After receiving IV contrast, you may experience a mild flushed feeling. This is normal. . On occasion, you may experience a mild rash up to 24 hours after the test. This is not dangerous. If this occurs, you can take Benadryl 25 mg and increase your fluid intake. . If you experience trouble breathing,  this can be serious. If it is severe call 911 IMMEDIATELY. If it is mild, please call our office. . If you take any of these medications: Glipizide/Metformin, Avandament, Glucavance, please do not take 48 hours after completing test unless otherwise instructed.   Once we have confirmed authorization from your insurance company, we will call you to set up a date and time for your test. Based on how quickly your insurance processes prior authorizations requests, please allow up to 4 weeks to be contacted for scheduling your Cardiac CT appointment. Be advised that routine Cardiac CT appointments could be scheduled as many as 8 weeks after your provider has ordered it.  For non-scheduling related questions, please contact the cardiac imaging nurse navigator should you have any questions/concerns: Marchia Bond, Cardiac Imaging Nurse Navigator Burley Saver, Interim Cardiac Imaging Nurse [facility] Direct Office Dial: [phone]   For scheduling needs, including cancellations and rescheduling, please call [place], [phone] (temporary number).      Follow-Up: At [facility], you and your health needs are our priority.  As part of our continuing mission to provide you with exceptional heart care, we have created designated Provider Care Teams.  These Care Teams include your primary Cardiologist (physician) and Advanced Practice Providers (APPs -  Physician Assistants and Nurse Practitioners) who all work together to provide you with the care you need, when you need it.  We recommend signing up for  the patient portal called "MyChart".  Sign up information is provided on this After Visit Summary.  MyChart is used to connect with patients for Virtual Visits (Telemedicine).  Patients are able to view lab/test results, encounter notes, upcoming appointments, etc.  Non-urgent messages can be sent to your provider as well.   To learn more about what you can do with MyChart, go to [email or web address].    Your next appointment:   3 month(s)  The format for your next appointment:   In Person  Provider:   Berniece Salines, DO   Other Instructions   Cardiac CT Angiogram A cardiac CT angiogram is a procedure to look at the heart and the area around the heart. It may be done to help find the cause of chest pains or other symptoms of heart disease. During this procedure, a substance called contrast dye is injected into the blood vessels in the area to be checked. A large X-ray machine, called a CT scanner, then takes detailed pictures of the heart and the surrounding area. The procedure is also sometimes called a coronary CT angiogram, coronary artery scanning, or CTA. A cardiac CT angiogram allows the health care provider to see how well blood is flowing to and from the heart. The health care provider will be able to see if there are any problems, such as:  Blockage or narrowing of the coronary arteries in the heart.  Fluid around the heart.  Signs of weakness or disease in the muscles, valves, and tissues of the heart. Tell a health care provider about:  Any allergies you have. This is especially important if you have had a previous allergic reaction to contrast dye.  All medicines you are taking, including vitamins, herbs, eye drops, creams, and over-the-counter medicines.  Any blood disorders you have.  Any surgeries you have had.  Any medical conditions you have.  Whether you are pregnant or may be pregnant.  Any anxiety disorders, chronic pain, or other conditions you have that  may increase your stress or prevent you  from lying still. What are the risks? Generally, this is a safe procedure. However, problems may occur, including:  Bleeding.  Infection.  Allergic reactions to medicines or dyes.  Damage to other structures or organs.  Kidney damage from the contrast dye that is used.  Increased risk of cancer from radiation exposure. This risk is low. Talk with your health care provider about: ? The risks and benefits of testing. ? How you can receive the lowest dose of radiation. What happens before the procedure?  Wear comfortable clothing and remove any jewelry, glasses, dentures, and hearing aids.  Follow instructions from your health care provider about eating and drinking. This may include: ? For 12 hours before the procedure -- avoid caffeine. This includes tea, coffee, soda, energy drinks, and diet pills. Drink plenty of water or other fluids that do not have caffeine in them. Being well hydrated can prevent complications. ? For 4-6 hours before the procedure -- stop eating and drinking. The contrast dye can cause nausea, but this is less likely if your stomach is empty.  Ask your health care provider about changing or stopping your regular medicines. This is especially important if you are taking diabetes medicines, blood thinners, or medicines to treat problems with erections (erectile dysfunction). What happens during the procedure?   Hair on your chest may need to be removed so that small sticky patches called electrodes can be placed on your chest. These will transmit information that helps to monitor your heart during the procedure.  An IV will be inserted into one of your veins.  You might be given a medicine to control your heart rate during the procedure. This will help to ensure that good images are obtained.  You will be asked to lie on an exam table. This table will slide in and out of the CT machine during the procedure.  Contrast dye  will be injected into the IV. You might feel warm, or you may get a metallic taste in your mouth.  You will be given a medicine called nitroglycerin. This will relax or dilate the arteries in your heart.  The table that you are lying on will move into the CT machine tunnel for the scan.  The person running the machine will give you instructions while the scans are being done. You may be asked to: ? Keep your arms above your head. ? Hold your breath. ? Stay very still, even if the table is moving.  When the scanning is complete, you will be moved out of the machine.  The IV will be removed. The procedure may vary among health care providers and hospitals. What can I expect after the procedure? After your procedure, it is common to have:  A metallic taste in your mouth from the contrast dye.  A feeling of warmth.  A headache from the nitroglycerin. Follow these instructions at home:  Take over-the-counter and prescription medicines only as told by your health care provider.  If you are told, drink enough fluid to keep your urine pale yellow. This will help to flush the contrast dye out of your body.  Most people can return to their normal activities right after the procedure. Ask your health care provider what activities are safe for you.  It is up to you to get the results of your procedure. Ask your health care provider, or the department that is doing the procedure, when your results will be ready.  Keep all follow-up visits as told by your  health care provider. This is important. Contact a health care provider if:  You have any symptoms of allergy to the contrast dye. These include: ? Shortness of breath. ? Rash or hives. ? A racing heartbeat. Summary  A cardiac CT angiogram is a procedure to look at the heart and the area around the heart. It may be done to help find the cause of chest pains or other symptoms of heart disease.  During this procedure, a large X-ray  machine, called a CT scanner, takes detailed pictures of the heart and the surrounding area after a contrast dye has been injected into blood vessels in the area.  Ask your health care provider about changing or stopping your regular medicines before the procedure. This is especially important if you are taking diabetes medicines, blood thinners, or medicines to treat erectile dysfunction.  If you are told, drink enough fluid to keep your urine pale yellow. This will help to flush the contrast dye out of your body. This information is not intended to replace advice given to you by your health care provider. Make sure you discuss any questions you have with your health care provider. Document Revised: 08/22/2018 Document Reviewed: 08/22/2018 Elsevier Patient Education  [place].      Adopting a Healthy Lifestyle.  Know what a healthy weight is for you (roughly BMI <25) and aim to maintain this   Aim for 7+ servings of fruits and vegetables daily   65-80+ fluid ounces of water or unsweet tea for healthy kidneys   Limit to max 1 drink of alcohol per day; avoid smoking/tobacco   Limit animal fats in diet for cholesterol and heart health - choose grass fed whenever available   Avoid highly processed foods, and foods high in saturated/trans fats   Aim for low stress - take time to unwind and care for your mental health   Aim for 150 min of moderate intensity exercise weekly for heart health, and weights twice weekly for bone health   Aim for 7-9 hours of sleep daily   When it comes to diets, agreement about the perfect plan isnt easy to find, even among the experts. Experts at the [employer] developed an idea known as the Healthy Eating Plate. Just imagine a plate divided into logical, healthy portions.   The emphasis is on diet quality:   Load up on vegetables and fruits - one-half of your plate: Aim for color and variety, and remember that potatoes  dont count.   Go for whole grains - one-quarter of your plate: Whole wheat, barley, wheat berries, quinoa, oats, brown rice, and foods made with them. If you want pasta, go with whole wheat pasta.   Protein power - one-quarter of your plate: Fish, chicken, beans, and nuts are all healthy, versatile protein sources. Limit red meat.   The diet, however, does go beyond the plate, offering a few other suggestions.   Use healthy plant oils, such as olive, canola, soy, corn, sunflower and peanut. Check the labels, and avoid partially hydrogenated oil, which have unhealthy trans fats.   If youre thirsty, drink water. Coffee and tea are good in moderation, but skip sugary drinks and limit milk and dairy products to one or two daily servings.   The type of carbohydrate in the diet is more important than the amount. Some sources of carbohydrates, such as vegetables, fruits, whole grains, and beans-are healthier than others.   Finally, stay active  Signed, Godfrey Pick  Riddik Senna, DO  11/27/2019 7:49 AM    [facility]

## 2019-11-26 NOTE — Patient Instructions (Signed)
Medication Instructions:  Your physician recommends that you continue on your current medications as directed. Please refer to the Current Medication list given to you today.  *If you need a refill on your cardiac medications before your next appointment, please call your pharmacy*   Lab Work: None. If you have labs (blood work) drawn today and your tests are completely normal, you will receive your results only by: Marland Kitchen MyChart Message (if you have MyChart) OR . A paper copy in the mail If you have any lab test that is abnormal or we need to change your treatment, we will call you to review the results.   Testing/Procedures: Your cardiac CT will be scheduled at one of the below locations:   [facility] [address] [place], [place] [address] [phone]  [place] [address] [place], [place] [address] [phone]  If scheduled at [facility], please arrive at the [facility] main entrance of [facility] 30 minutes prior to test start time. Proceed to the [facility] Radiology Department (first floor) to check-in and test prep.  If scheduled at [facility], please arrive 15 mins early for check-in and test prep.  Please follow these instructions carefully (unless otherwise directed):  Hold all erectile dysfunction medications at least 3 days (72 hrs) prior to test.  On the Night Before the Test: . Be sure to Drink plenty of water. . Do not consume any caffeinated/decaffeinated beverages or chocolate 12 hours prior to your test. . Do not take any antihistamines 12 hours prior to your test.   On the Day of the Test: . Drink plenty of water. Do not drink any water within one hour of the test. . Do not eat any food 4 hours prior to the test. . You may take your regular medications prior to the test.  . Take metoprolol (Lopressor) two hours prior to test.           After the Test: . Drink plenty of water. . After receiving IV contrast, you may experience a mild flushed feeling. This is normal. . On occasion, you may experience a mild rash up to 24 hours after the test. This is not dangerous. If this occurs, you can take Benadryl 25 mg and increase your fluid intake. . If you experience trouble breathing, this can be serious. If it is severe call 911 IMMEDIATELY. If it is mild, please call our office. . If you take any of these medications: Glipizide/Metformin, Avandament, Glucavance, please do not take 48 hours after completing test unless otherwise instructed.   Once we have confirmed authorization from your insurance company, we will call you to set up a date and time for your test. Based on how quickly your insurance processes prior authorizations requests, please allow up to 4 weeks to be contacted for scheduling your Cardiac CT appointment. Be advised that routine Cardiac CT appointments could be scheduled as many as 8 weeks after your provider has ordered it.  For non-scheduling related questions, please contact the cardiac imaging nurse navigator should you have any questions/concerns: Marchia Bond, Cardiac Imaging Nurse Navigator Burley Saver, Interim Cardiac Imaging Nurse [place] [facility] Direct Office Dial: [phone]   For scheduling needs, including cancellations and rescheduling, please call [place], [phone] (temporary number).      Follow-Up: At [facility], you and your health needs are our priority.  As part of our  continuing mission to provide you with exceptional heart care, we have created designated Provider Care Teams.  These Care Teams include your primary Cardiologist (physician) and Advanced Practice Providers (APPs -  Physician Assistants and Nurse Practitioners) who all work together to provide you with the care you need, when you need it.  We recommend signing up for the patient  portal called "MyChart".  Sign up information is provided on this After Visit Summary.  MyChart is used to connect with patients for Virtual Visits (Telemedicine).  Patients are able to view lab/test results, encounter notes, upcoming appointments, etc.  Non-urgent messages can be sent to your provider as well.   To learn more about what you can do with MyChart, go to [email or web address].    Your next appointment:   3 month(s)  The format for your next appointment:   In Person  Provider:   Berniece Salines, DO   Other Instructions   Cardiac CT Angiogram A cardiac CT angiogram is a procedure to look at the heart and the area around the heart. It may be done to help find the cause of chest pains or other symptoms of heart disease. During this procedure, a substance called contrast dye is injected into the blood vessels in the area to be checked. A large X-ray machine, called a CT scanner, then takes detailed pictures of the heart and the surrounding area. The procedure is also sometimes called a coronary CT angiogram, coronary artery scanning, or CTA. A cardiac CT angiogram allows the health care provider to see how well blood is flowing to and from the heart. The health care provider will be able to see if there are any problems, such as:  Blockage or narrowing of the coronary arteries in the heart.  Fluid around the heart.  Signs of weakness or disease in the muscles, valves, and tissues of the heart. Tell a health care provider about:  Any allergies you have. This is especially important if you have had a previous allergic reaction to contrast dye.  All medicines you are taking, including vitamins, herbs, eye drops, creams, and over-the-counter medicines.  Any blood disorders you have.  Any surgeries you have had.  Any medical conditions you have.  Whether you are pregnant or may be pregnant.  Any anxiety disorders, chronic pain, or other conditions you have that may increase  your stress or prevent you from lying still. What are the risks? Generally, this is a safe procedure. However, problems may occur, including:  Bleeding.  Infection.  Allergic reactions to medicines or dyes.  Damage to other structures or organs.  Kidney damage from the contrast dye that is used.  Increased risk of cancer from radiation exposure. This risk is low. Talk with your health care provider about: ? The risks and benefits of testing. ? How you can receive the lowest dose of radiation. What happens before the procedure?  Wear comfortable clothing and remove any jewelry, glasses, dentures, and hearing aids.  Follow instructions from your health care provider about eating and drinking. This may include: ? For 12 hours before the procedure -- avoid caffeine. This includes tea, coffee, soda, energy drinks, and diet pills. Drink plenty of water or other fluids that do not have caffeine in them. Being well hydrated can prevent complications. ? For 4-6 hours before the procedure -- stop eating and drinking. The contrast dye can cause nausea, but this is less likely if your stomach is empty.  Ask your health care provider about  changing or stopping your regular medicines. This is especially important if you are taking diabetes medicines, blood thinners, or medicines to treat problems with erections (erectile dysfunction). What happens during the procedure?   Hair on your chest may need to be removed so that small sticky patches called electrodes can be placed on your chest. These will transmit information that helps to monitor your heart during the procedure.  An IV will be inserted into one of your veins.  You might be given a medicine to control your heart rate during the procedure. This will help to ensure that good images are obtained.  You will be asked to lie on an exam table. This table will slide in and out of the CT machine during the procedure.  Contrast dye will be  injected into the IV. You might feel warm, or you may get a metallic taste in your mouth.  You will be given a medicine called nitroglycerin. This will relax or dilate the arteries in your heart.  The table that you are lying on will move into the CT machine tunnel for the scan.  The person running the machine will give you instructions while the scans are being done. You may be asked to: ? Keep your arms above your head. ? Hold your breath. ? Stay very still, even if the table is moving.  When the scanning is complete, you will be moved out of the machine.  The IV will be removed. The procedure may vary among health care providers and hospitals. What can I expect after the procedure? After your procedure, it is common to have:  A metallic taste in your mouth from the contrast dye.  A feeling of warmth.  A headache from the nitroglycerin. Follow these instructions at home:  Take over-the-counter and prescription medicines only as told by your health care provider.  If you are told, drink enough fluid to keep your urine pale yellow. This will help to flush the contrast dye out of your body.  Most people can return to their normal activities right after the procedure. Ask your health care provider what activities are safe for you.  It is up to you to get the results of your procedure. Ask your health care provider, or the department that is doing the procedure, when your results will be ready.  Keep all follow-up visits as told by your health care provider. This is important. Contact a health care provider if:  You have any symptoms of allergy to the contrast dye. These include: ? Shortness of breath. ? Rash or hives. ? A racing heartbeat. Summary  A cardiac CT angiogram is a procedure to look at the heart and the area around the heart. It may be done to help find the cause of chest pains or other symptoms of heart disease.  During this procedure, a large X-ray machine,  called a CT scanner, takes detailed pictures of the heart and the surrounding area after a contrast dye has been injected into blood vessels in the area.  Ask your health care provider about changing or stopping your regular medicines before the procedure. This is especially important if you are taking diabetes medicines, blood thinners, or medicines to treat erectile dysfunction.  If you are told, drink enough fluid to keep your urine pale yellow. This will help to flush the contrast dye out of your body. This information is not intended to replace advice given to you by your health care provider. Make sure you  discuss any questions you have with your health care provider. Document Revised: 08/22/2018 Document Reviewed: 08/22/2018 Elsevier Patient Education  [place].

## 2019-12-17 NOTE — Addendum Note (Signed)
Addended by: Hazle Quant on: 12/17/2019 01:16 PM   Modules accepted: Orders

## 2020-01-06 ENCOUNTER — Telehealth (HOSPITAL_COMMUNITY): Payer: Self-pay | Admitting: Emergency Medicine

## 2020-01-06 NOTE — Telephone Encounter (Signed)
Calling patient to review CCTA instructions. Pt states he was exposed to covid (by wife who tested positive).   I r/s his CCTA appt 2 weeks out. (new appt 01/21/20 at 11:30am) Pt verbalized understanding and appreciated the call.  Will reach out to him again closer to appt to review instructions for test.  Rockwell Alexandria RN Navigator Cardiac Imaging [facility] [phone] Office  [phone] Cell

## 2020-01-07 ENCOUNTER — Ambulatory Visit (HOSPITAL_COMMUNITY): Payer: 59

## 2020-01-20 ENCOUNTER — Telehealth (HOSPITAL_COMMUNITY): Payer: Self-pay | Admitting: Emergency Medicine

## 2020-01-20 NOTE — Telephone Encounter (Signed)
Reaching out to patient to offer assistance regarding upcoming cardiac imaging study; pt verbalizes understanding of appt date/time, parking situation and where to check in, pre-test NPO status and medications ordered, and verified current allergies; name and call back number provided for further questions should they arise Rockwell Alexandria RN Navigator Cardiac Imaging [facility] [phone] office [phone] cell  100mg  metoprolol PTA

## 2020-01-21 ENCOUNTER — Ambulatory Visit (HOSPITAL_COMMUNITY)
Admission: RE | Admit: 2020-01-21 | Discharge: 2020-01-21 | Disposition: A | Discharge status: Home / Self Care | Payer: 59 | Source: Ambulatory Visit | Attending: Cardiology | Admitting: Cardiology

## 2020-01-21 ENCOUNTER — Encounter: Payer: Self-pay | Admitting: *Deleted

## 2020-01-21 ENCOUNTER — Other Ambulatory Visit: Payer: Self-pay

## 2020-01-21 DIAGNOSIS — R0789 Other chest pain: Secondary | ICD-10-CM

## 2020-01-21 DIAGNOSIS — I2510 Atherosclerotic heart disease of native coronary artery without angina pectoris: Secondary | ICD-10-CM

## 2020-01-21 DIAGNOSIS — Z006 Encounter for examination for normal comparison and control in clinical research program: Secondary | ICD-10-CM

## 2020-01-21 MED ORDER — NITROGLYCERIN 0.4 MG SL SUBL
0.8000 mg | SUBLINGUAL_TABLET | Freq: Once | SUBLINGUAL | Status: AC
  Administered 2020-01-21: 0.8 mg via SUBLINGUAL

## 2020-01-21 MED ORDER — NITROGLYCERIN 0.4 MG SL SUBL
SUBLINGUAL_TABLET | SUBLINGUAL | Status: AC
  Filled 2020-01-21: qty 2

## 2020-01-21 MED ORDER — IOHEXOL 350 MG/ML SOLN
80.0000 mL | Freq: Once | INTRAVENOUS | Status: AC | PRN
  Administered 2020-01-21: 80 mL via INTRAVENOUS

## 2020-01-21 NOTE — Research (Signed)
IDENTIFY Informed Consent                  Subject Name:   Jaymen Fetch   Subject met inclusion and exclusion criteria.  The informed consent form, study requirements and expectations were reviewed with the subject and questions and concerns were addressed prior to the signing of the consent form.  The subject verbalized understanding of the trial requirements.  The subject agreed to participate in the IDENTIFY trial and signed the informed consent.  The informed consent was obtained prior to performance of any protocol-specific procedures for the subject.  A copy of the signed informed consent was given to the subject and a copy was placed in the subject's medical record.   [place] Raif Chachere, Research Assistant  01/21/2020  10:48

## 2020-01-22 DIAGNOSIS — I2510 Atherosclerotic heart disease of native coronary artery without angina pectoris: Secondary | ICD-10-CM | POA: Diagnosis not present

## 2020-01-23 ENCOUNTER — Telehealth: Payer: Self-pay

## 2020-01-23 MED ORDER — ASPIRIN EC 81 MG PO TBEC: 81.0000 mg | DELAYED_RELEASE_TABLET | Freq: Every day | ORAL | 3 refills | Status: AC

## 2020-01-23 MED ORDER — ROSUVASTATIN CALCIUM 20 MG PO TABS: 20.0000 mg | ORAL_TABLET | Freq: Every day | ORAL | 3 refills | Status: DC

## 2020-01-23 NOTE — Telephone Encounter (Signed)
Spoke with patient regarding results and recommendation.  Patient verbalizes understanding and is agreeable to plan of care. Advised patient to call back with any issues or concerns.  

## 2020-01-23 NOTE — Telephone Encounter (Signed)
-----   Message from Thomasene Ripple, DO sent at 01/23/2020  9:26 AM EST ----- Your CT scan show evidence of coronary artery disease, I like to start you on aspirin 81 mg daily and Crestor 20 mg daily.  We can discuss more details at your next visit.

## 2020-03-10 ENCOUNTER — Ambulatory Visit: Payer: 59 | Admitting: Cardiology

## 2020-04-08 DIAGNOSIS — Z8241 Family history of sudden cardiac death: Secondary | ICD-10-CM | POA: Insufficient documentation

## 2020-04-08 DIAGNOSIS — M1990 Unspecified osteoarthritis, unspecified site: Secondary | ICD-10-CM | POA: Insufficient documentation

## 2020-04-09 ENCOUNTER — Other Ambulatory Visit: Payer: Self-pay

## 2020-04-09 ENCOUNTER — Ambulatory Visit: Payer: 59 | Admitting: Cardiology

## 2020-04-09 ENCOUNTER — Encounter: Payer: Self-pay | Admitting: Cardiology

## 2020-04-09 VITALS — BP 130/88 | HR 65 | Ht 70.0 in | Wt 213.8 lb

## 2020-04-09 DIAGNOSIS — I10 Essential (primary) hypertension: Secondary | ICD-10-CM | POA: Insufficient documentation

## 2020-04-09 DIAGNOSIS — I2510 Atherosclerotic heart disease of native coronary artery without angina pectoris: Secondary | ICD-10-CM

## 2020-04-09 MED ORDER — ATORVASTATIN CALCIUM 20 MG PO TABS: 20.0000 mg | ORAL_TABLET | Freq: Every day | ORAL | 3 refills | Status: AC

## 2020-04-09 MED ORDER — LISINOPRIL 20 MG PO TABS: 20.0000 mg | ORAL_TABLET | Freq: Every day | ORAL | 3 refills | Status: DC

## 2020-04-09 NOTE — Progress Notes (Signed)
Cardiology Office Note:    Date:  04/09/2020   ID:  Dustin Mclean, DOB 1961-01-09, MRN [id number]  PCP:  Dema Severin, NP  Cardiologist:  Thomasene Ripple, DO  Electrophysiologist:  None   Referring MD: Dema Severin, NP   I am experiencing some rectal itching since I started Crestor  History of Present Illness:    Dustin Mclean is a 60 y.o. male with a hx of hypertension, family history of premature coronary artery disease, hyperlipidemia, coronary artery disease which is seen on coronary CTA.  Patient is here today for follow-up visit. Since he had his coronary CTA done this is his first follow-up.  I educated the patient about the findings of his CTA which showed moderate coronary artery disease.  We also did FFR CT which was normal. Today he tells me that he is having problems since being started on Crestor he has had significant rectal itching, he also notes that he feels strongly that the medication is causing this.  Past Medical History:  Diagnosis Date  . Arthritis   . Family history of sudden cardiac death (SCD)   . Hypertension   . Pain in unspecified shoulder   . Polyarthritis, unspecified   . Rotator cuff tear, right 11/25/2011  . Testicular hypofunction     Past Surgical History:  Procedure Laterality Date  . COLONOSCOPY  05/10/2013  . HERNIA REPAIR  2006 and yrs ago   x 2  . SHOULDER ARTHROSCOPY WITH ROTATOR CUFF REPAIR AND SUBACROMIAL DECOMPRESSION  11/25/2011   Procedure: SHOULDER ARTHROSCOPY WITH ROTATOR CUFF REPAIR AND SUBACROMIAL DECOMPRESSION;  Surgeon: Javier Docker, MD;  Location: [facility];  Service: Orthopedics;  Laterality: Right;  RIGHT SHOULDER ARTHROSCOPY WITH ROTATOR CUFF REPAIR/SUBACROMIAL DECOMPRESSION WITH MINI OPEN ROTATOR CUFF REPAIR    Current Medications: Current Meds  Medication Sig  . aspirin EC 81 MG tablet Take 1 tablet (81 mg total) by mouth daily. Swallow whole.  Marland Kitchen atorvastatin (LIPITOR) 20 MG tablet Take 1 tablet (20 mg total) by mouth  daily.  . diclofenac (VOLTAREN) 75 MG EC tablet Take 75 mg by mouth in the morning and at bedtime.  . fluticasone (FLONASE) 50 MCG/ACT nasal spray Place 2 sprays into the nose every morning.   Marland Kitchen lisinopril (ZESTRIL) 20 MG tablet Take 1 tablet (20 mg total) by mouth daily.  . Multiple Vitamins-Minerals (MULTIVITAMIN WITH MINERALS) tablet Take 1 tablet by mouth daily.  . Omega-3 Fatty Acids (FISH OIL) 1000 MG CAPS Take 1,000 mg by mouth 2 (two) times daily.  . sildenafil (REVATIO) 20 MG tablet Take 20 mg by mouth 3 (three) times daily as needed (Erectile dysfunction).  . [DISCONTINUED] rosuvastatin (CRESTOR) 20 MG tablet Take 1 tablet (20 mg total) by mouth daily.     Allergies:   Caffeine   Social History   Socioeconomic History  . Marital status: Married    Spouse name: Not on file  . Number of children: Not on file  . Years of education: Not on file  . Highest education level: Not on file  Occupational History  . Not on file  Tobacco Use  . Smoking status: Never Smoker  . Smokeless tobacco: Never Used  Substance and Sexual Activity  . Alcohol use: No  . Drug use: No  . Sexual activity: Not on file  Other Topics Concern  . Not on file  Social History Narrative  . Not on file   Social Determinants of Health   Financial Resource Strain: Not  on file  Food Insecurity: Not on file  Transportation Needs: Not on file  Physical Activity: Not on file  Stress: Not on file  Social Connections: Not on file     Family History: The patient's family history includes COPD in his father; Cancer in his maternal grandfather; Glaucoma in his mother; Heart disease in his father; High Cholesterol in his father; High blood pressure in his father, maternal grandmother, and mother.  ROS:   Review of Systems  Constitution: Negative for decreased appetite, fever and weight gain.  HENT: Negative for congestion, ear discharge, hoarse voice and sore throat.   Eyes: Negative for discharge,  redness, vision loss in right eye and visual halos.  Cardiovascular: Negative for chest pain, dyspnea on exertion, leg swelling, orthopnea and palpitations.  Respiratory: Negative for cough, hemoptysis, shortness of breath and snoring.   Endocrine: Negative for heat intolerance and polyphagia.  Hematologic/Lymphatic: Negative for bleeding problem. Does not bruise/bleed easily.  Skin: Negative for flushing, nail changes, rash and suspicious lesions.  Musculoskeletal: Negative for arthritis, joint pain, muscle cramps, myalgias, neck pain and stiffness.  Gastrointestinal: Negative for abdominal pain, bowel incontinence, diarrhea and excessive appetite.  Genitourinary: Negative for decreased libido, genital sores and incomplete emptying.  Neurological: Negative for brief paralysis, focal weakness, headaches and loss of balance.  Psychiatric/Behavioral: Negative for altered mental status, depression and suicidal ideas.  Allergic/Immunologic: Negative for HIV exposure and persistent infections.    EKGs/Labs/Other Studies Reviewed:    The following studies were reviewed today:   EKG: None today   Coronary CTA 01/21/2020 Aorta: Normal size.  No calcifications.  No dissection.  Aortic Valve:  Trileaflet.  No calcifications.  Coronary calcium score: 1717  Coronary Arteries:  Normal coronary origin.  Right dominance.  RCA is a large dominant artery that gives rise to PDA and PLVB. There is a mild (25-49%) calcified plaque in the proximal RCA. The proximal to mid RCA with minimal soft plaque. In the distal RCA there is a mild long (24 mm) calcified plaque.  Left main is a large artery that gives rise to LAD, Intermedius Ramus and LCX arteries.  Ramus Intermedius - there are no plaques.  LAD is a large vessel. There is a minimal (<24%) calcified plaque in the proximal LAD. The mid LAD with several separate plaques: First two are mild calcified plaques wit the third being a  moderate (50-69%) calcified plaque. The mid-distal LAD with a moderate calcified plaque. D1 and D2 are small caliber vessels with no plaque. D3 is a medium sized vessel with minimal mid vessel calcification.  LCX is a non-dominant artery that gives rise to one large OM1 branch. There is no plaque.  Other findings:  Normal pulmonary vein drainage into the left atrium.  Normal left atrial appendage without a thrombus.  Normal size of the pulmonary artery.  IMPRESSION: 1. Coronary calcium score of 1717. This was 0 percentile for age and sex matched control.  2. Normal coronary origin with right dominance.  3. Moderate Coronary artery disease. CADRADS 3. This study will be send for FFRct.  CT FFR 01/21/2020 Left Main: 0.99  2. LAD: Proximal 0.99, Mid 0.96, Distal 0.92  3. LCX: Proximal 0.99, Mid 0.98  4. Ramus: Proximal 0.99, Mid 0.98  5. RCA: Proximal 0.99, Mid 0.95, Distal 0.92  IMPRESSION: There are no flow limiting lesions per FFRct result above. Continue with medical management.    Recent Labs: No results found for requested labs within last 8760 hours.  Recent Lipid Panel No results found for: CHOL, TRIG, HDL, CHOLHDL, VLDL, LDLCALC, LDLDIRECT  Physical Exam:    VS:  BP 130/88   Pulse 65   Ht 5\' 10"  (1.778 m)   Wt 213 lb 12.8 oz (97 kg)   SpO2 95%   BMI 30.68 kg/m     Wt Readings from Last 3 Encounters:  04/09/20 213 lb 12.8 oz (97 kg)  11/26/19 215 lb (97.5 kg)  11/25/11 235 lb (106.6 kg)     GEN: Well nourished, well developed in no acute distress HEENT: Normal NECK: No JVD; No carotid bruits LYMPHATICS: No lymphadenopathy CARDIAC: S1S2 noted,RRR, no murmurs, rubs, gallops RESPIRATORY:  Clear to auscultation without rales, wheezing or rhonchi  ABDOMEN: Soft, non-tender, non-distended, +bowel sounds, no guarding. EXTREMITIES: No edema, No cyanosis, no clubbing MUSCULOSKELETAL:  No deformity  SKIN: Warm and dry NEUROLOGIC:  Alert  and oriented x 3, non-focal PSYCHIATRIC:  Normal affect, good insight  ASSESSMENT:    1. Coronary artery disease involving native coronary artery of native heart without angina pectoris   2. Primary hypertension   3. Morbid obesity (HCC)    PLAN:    I have asked the patient if his new diagnosis of coronary artery disease per explained to him the finding of his coronary CTA.  He will remain on his 81 mg aspirin.  He is currently on Crestor which is causing rectal itching for the patient so we will stop this medication and transition him to Lipitor 20 mg daily per  He is ran out of his lisinopril, restart lisinopril 20 mg daily.  The patient understands the need to lose weight with diet and exercise. We have discussed specific strategies for this.  The patient is in agreement with the above plan. The patient left the office in stable condition.  The patient will follow up in 1 year.   Medication Adjustments/Labs and Tests Ordered: Current medicines are reviewed at length with the patient today.  Concerns regarding medicines are outlined above.  No orders of the defined types were placed in this encounter.  Meds ordered this encounter  Medications  . atorvastatin (LIPITOR) 20 MG tablet    Sig: Take 1 tablet (20 mg total) by mouth daily.    Dispense:  90 tablet    Refill:  3  . lisinopril (ZESTRIL) 20 MG tablet    Sig: Take 1 tablet (20 mg total) by mouth daily.    Dispense:  90 tablet    Refill:  3    Patient Instructions  Medication Instructions:  Your physician has recommended you make the following change in your medication: STOP: Crestor  START: Lipitor 20 mg once daily START:  Lisinopril 20 mg once daily *If you need a refill on your cardiac medications before your next appointment, please call your pharmacy*   Lab Work: None If you have labs (blood work) drawn today and your tests are completely normal, you will receive your results only by: Marland Kitchen. MyChart Message (if  you have MyChart) OR . A paper copy in the mail If you have any lab test that is abnormal or we need to change your treatment, we will call you to review the results.   Testing/Procedures: None   Follow-Up: At [facility], you and your health needs are our priority.  As part of our continuing mission to provide you with exceptional heart care, we have created designated Provider Care Teams.  These Care Teams include your primary Cardiologist (physician) and  Advanced Practice Providers (APPs -  Physician Assistants and Nurse Practitioners) who all work together to provide you with the care you need, when you need it.  We recommend signing up for the patient portal called "MyChart".  Sign up information is provided on this After Visit Summary.  MyChart is used to connect with patients for Virtual Visits (Telemedicine).  Patients are able to view lab/test results, encounter notes, upcoming appointments, etc.  Non-urgent messages can be sent to your provider as well.   To learn more about what you can do with MyChart, go to [email or web address].    Your next appointment:   1 year(s)  The format for your next appointment:   In Person  Provider:   Thomasene Ripple, DO   Other Instructions      Adopting a Healthy Lifestyle.  Know what a healthy weight is for you (roughly BMI <25) and aim to maintain this   Aim for 7+ servings of fruits and vegetables daily   65-80+ fluid ounces of water or unsweet tea for healthy kidneys   Limit to max 1 drink of alcohol per day; avoid smoking/tobacco   Limit animal fats in diet for cholesterol and heart health - choose grass fed whenever available   Avoid highly processed foods, and foods high in saturated/trans fats   Aim for low stress - take time to unwind and care for your mental health   Aim for 150 min of moderate intensity exercise weekly for heart health, and weights twice weekly for bone health   Aim for 7-9 hours of sleep daily    When it comes to diets, agreement about the perfect plan isnt easy to find, even among the experts. Experts at the [facility] [employer] developed an idea known as the Healthy Eating Plate. Just imagine a plate divided into logical, healthy portions.   The emphasis is on diet quality:   Load up on vegetables and fruits - one-half of your plate: Aim for color and variety, and remember that potatoes dont count.   Go for whole grains - one-quarter of your plate: Whole wheat, barley, wheat berries, quinoa, oats, brown rice, and foods made with them. If you want pasta, go with whole wheat pasta.   Protein power - one-quarter of your plate: Fish, chicken, beans, and nuts are all healthy, versatile protein sources. Limit red meat.   The diet, however, does go beyond the plate, offering a few other suggestions.   Use healthy plant oils, such as olive, canola, soy, corn, sunflower and peanut. Check the labels, and avoid partially hydrogenated oil, which have unhealthy trans fats.   If youre thirsty, drink water. Coffee and tea are good in moderation, but skip sugary drinks and limit milk and dairy products to one or two daily servings.   The type of carbohydrate in the diet is more important than the amount. Some sources of carbohydrates, such as vegetables, fruits, whole grains, and beans-are healthier than others.   Finally, stay active  Signed, Thomasene Ripple, DO  04/09/2020 3:44 PM    [facility]

## 2020-04-09 NOTE — Patient Instructions (Signed)
Medication Instructions:  Your physician has recommended you make the following change in your medication: STOP: Crestor  START: Lipitor 20 mg once daily START:  Lisinopril 20 mg once daily *If you need a refill on your cardiac medications before your next appointment, please call your pharmacy*   Lab Work: None If you have labs (blood work) drawn today and your tests are completely normal, you will receive your results only by: Marland Kitchen MyChart Message (if you have MyChart) OR . A paper copy in the mail If you have any lab test that is abnormal or we need to change your treatment, we will call you to review the results.   Testing/Procedures: None   Follow-Up: At [facility], you and your health needs are our priority.  As part of our continuing mission to provide you with exceptional heart care, we have created designated Provider Care Teams.  These Care Teams include your primary Cardiologist (physician) and Advanced Practice Providers (APPs -  Physician Assistants and Nurse Practitioners) who all work together to provide you with the care you need, when you need it.  We recommend signing up for the patient portal called "MyChart".  Sign up information is provided on this After Visit Summary.  MyChart is used to connect with patients for Virtual Visits (Telemedicine).  Patients are able to view lab/test results, encounter notes, upcoming appointments, etc.  Non-urgent messages can be sent to your provider as well.   To learn more about what you can do with MyChart, go to [email or web address].    Your next appointment:   1 year(s)  The format for your next appointment:   In Person  Provider:   Thomasene Ripple, DO   Other Instructions

## 2020-05-29 ENCOUNTER — Telehealth: Payer: Self-pay

## 2020-05-29 DIAGNOSIS — Z006 Encounter for examination for normal comparison and control in clinical research program: Secondary | ICD-10-CM

## 2020-05-29 NOTE — Telephone Encounter (Signed)
I called patient for his 90-day Identify Study follow up phone call. Patient is doing well with no cardiac symptoms at this time. I reminded patient I would call him in January for his 1 year follow-up. 

## 2020-10-07 ENCOUNTER — Telehealth: Payer: Self-pay | Admitting: Cardiology

## 2020-10-07 MED ORDER — LISINOPRIL 20 MG PO TABS: 20.0000 mg | ORAL_TABLET | Freq: Every day | ORAL | 1 refills | Status: DC

## 2020-10-07 NOTE — Telephone Encounter (Signed)
Medication filled.  

## 2020-10-07 NOTE — Telephone Encounter (Signed)
 *  STAT* If patient is at the pharmacy, call can be transferred to refill team.   1. Which medications need to be refilled? (please list name of each medication and dose if known)   lisinopril (ZESTRIL) 20 MG tablet     2. Which pharmacy/location (including street and city if local pharmacy) is medication to be sent to? [facility] pharmacy  Phone# [phone] Fax# [phone]  3. Do they need a 30 day or 90 day supply? 90 days

## 2020-12-22 ENCOUNTER — Other Ambulatory Visit: Payer: Self-pay

## 2020-12-22 MED ORDER — LISINOPRIL 20 MG PO TABS: 20.0000 mg | ORAL_TABLET | Freq: Every day | ORAL | 1 refills | Status: AC

## 2022-01-24 IMAGING — CT CT HEART MORP W/ CTA COR W/ SCORE W/ CA W/CM &/OR W/O CM
4 of 7 series · 9 of 20 positions shown, 10 images · IV contrast (APPLIED)
Comparison: None.
COMPARISON: None.
COMPARISON: None.

Addendum:
EXAM:
OVER-READ INTERPRETATION  CT CHEST

The following report is an over-read performed by radiologist Dr.
Nerfi Ruz [REDACTED] on 01/21/2020. This
over-read does not include interpretation of cardiac or coronary
anatomy or pathology. The coronary calcium score/coronary CTA
interpretation by the cardiologist is attached.
CLINICAL DATA: This is a 59 year old male with chest pain. Medical
history includes hypertension, hyperlipidemia.
Cardiac/Coronary  CT
TECHNIQUE: The patient was scanned on a [employer] scanner.

[Series 6: best diast 75 % · axial · 0.40mm/px · z∈[+1223,+1296]mm · 3 of 368 slices shown, 4 images]
[im 92/368  vessel]
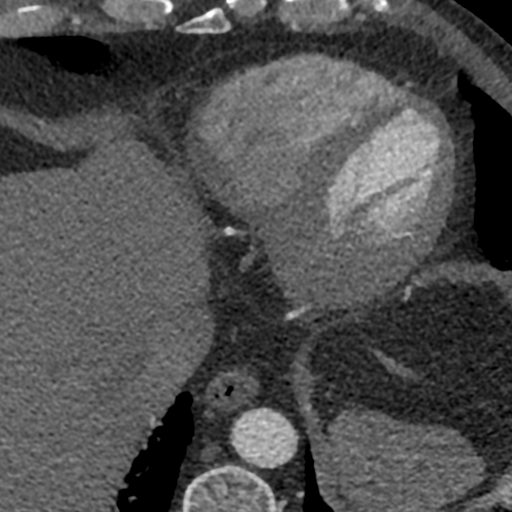
[im 92/368  lung]
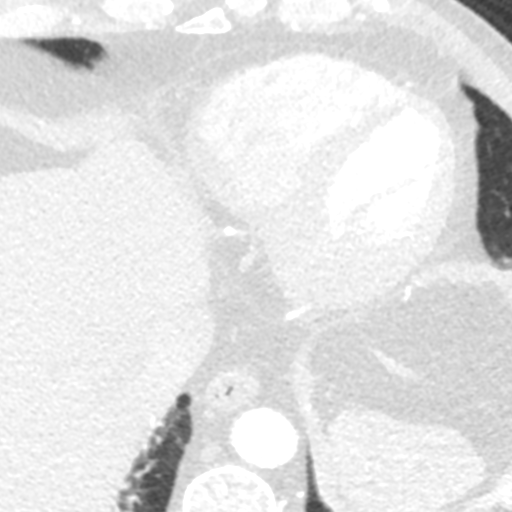
[im 184/368  vessel]
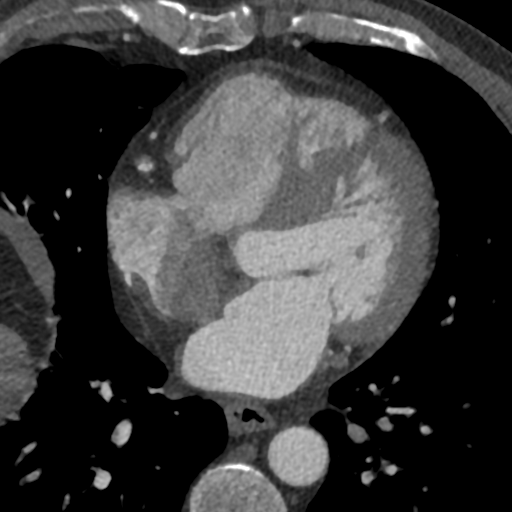
[im 276/368  vessel]
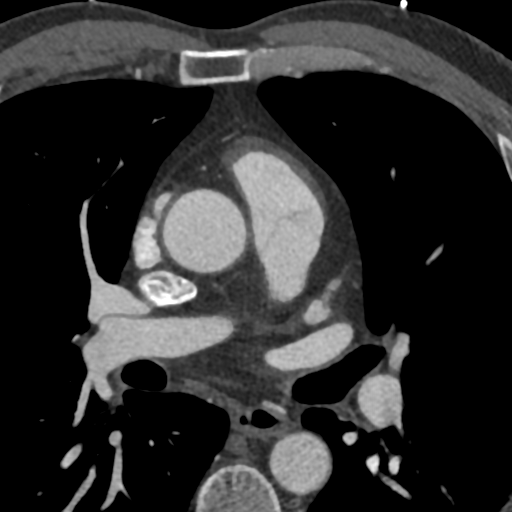

[Series 7: best syst 32 % · axial · 0.40mm/px · z∈[+1239,+1282]mm · 2 of 318 slices shown]
[im 106/318  vessel]
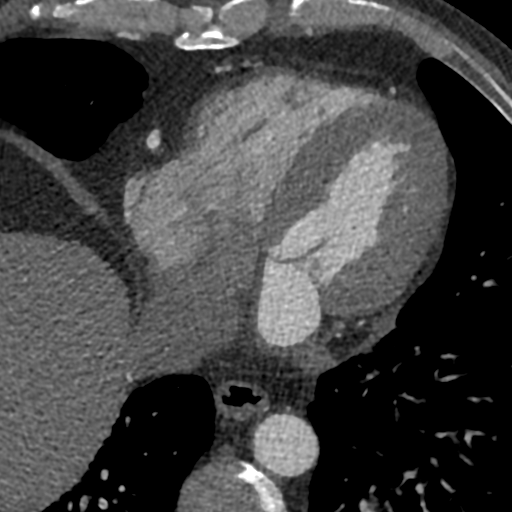
[im 212/318  vessel]
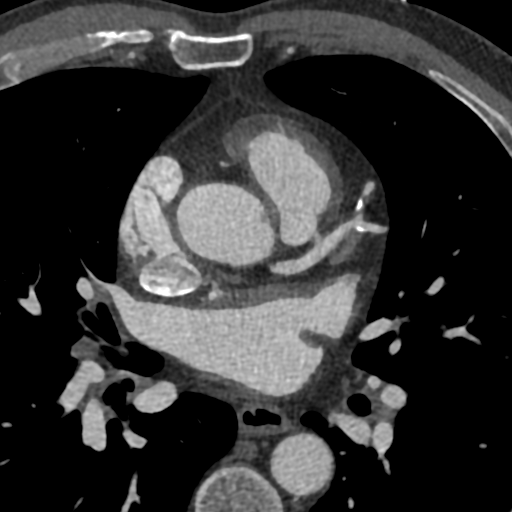

[Series 8: ts diast sharp 75 % · axial · 0.40mm/px · z∈[+1239,+1282]mm · 2 of 318 slices shown]
[im 106/318  lung]
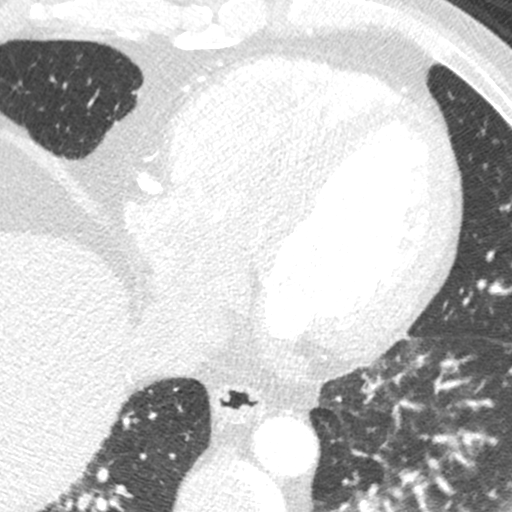
[im 212/318  lung]
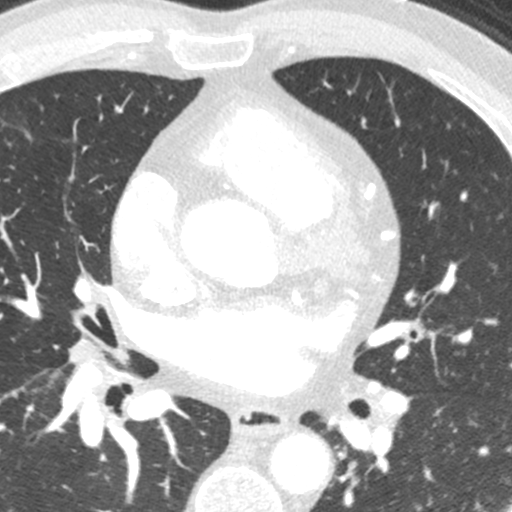

[Series 9: ts syst sharp 32 % · axial · 0.40mm/px · z∈[+1239,+1282]mm · 2 of 318 slices shown]
[im 106/318  lung]
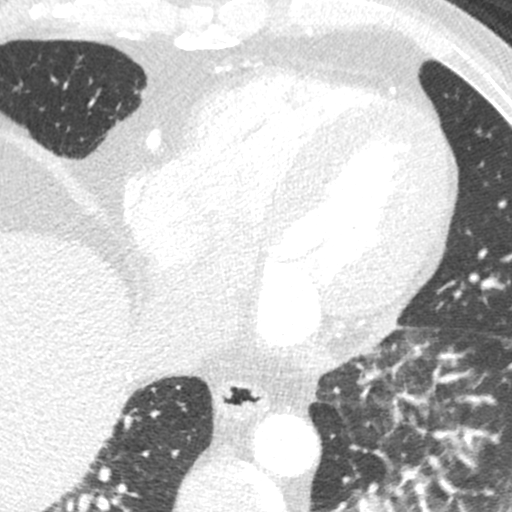
[im 212/318  lung]
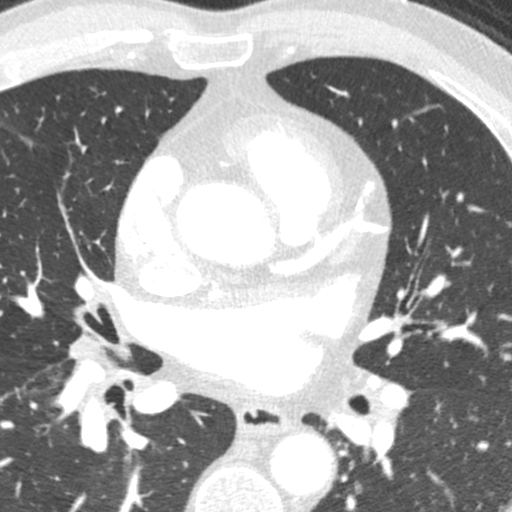

[9 of 20 positions shown; findings below may reference images not displayed]

FINDINGS: Scattered areas of scarring and architectural distortion in the
lungs bilaterally, likely sequela of prior infection. Some
ground-glass attenuation also noted in the lower lobes of the lungs
bilaterally. 4 mm left lower lobe pulmonary nodule (axial image 31
of series 12). Within the visualized portions of the thorax there
are no other larger more suspicious appearing pulmonary nodules or
masses, there is no acute consolidative airspace disease, no pleural
effusions, no pneumothorax and no lymphadenopathy. Visualized
portions of the upper abdomen are unremarkable. There are no
aggressive appearing lytic or blastic lesions noted in the
visualized portions of the skeleton.
IMPRESSION: 1. Bilateral lower lobe ground-glass attenuation. This is
nonspecific, and no prior studies are available for comparison.
Clinical correlation for signs and symptoms of viral infection is
suggested.
2. 4 mm left lower lobe pulmonary nodule (axial image 31 of series
12), nonspecific, but statistically likely benign. No follow-up
needed if patient is low-risk. Non-contrast chest CT can be
considered in 12 months if patient is high-risk. This recommendation
follows the consensus statement: Guidelines for Management of
Incidental Pulmonary Nodules Detected on CT Images: From the
FINDINGS: A 120 kV prospective scan was triggered in the descending thoracic
aorta at 111 HU's. Axial non-contrast 3 mm slices were carried out
through the heart. The data set was analyzed on a dedicated work
station and scored using the Agatson method. Gantry rotation speed
was 250 msecs and collimation was .6 mm. No beta blockade and 0.8 mg
of sl NTG was given. The 3D data set was reconstructed in 5%
intervals of the 67-82 % of the R-R cycle. Diastolic phases were
analyzed on a dedicated work station using MPR, MIP and VRT modes.
The patient received 80 cc of contrast.

Aorta: Normal size.  No calcifications.  No dissection.

Aortic Valve:  Trileaflet.  No calcifications.

Coronary calcium score: 9595

Coronary Arteries:  Normal coronary origin.  Right dominance.

RCA is a large dominant artery that gives rise to PDA and PLVB.
There is a mild (25-49%) calcified plaque in the proximal RCA. The
proximal to mid RCA with minimal soft plaque. In the distal RCA
there is a mild long (24 mm) calcified plaque.

Left main is a large artery that gives rise to LAD, Intermedius
Ramus and LCX arteries.

Ramus Intermedius - there are no plaques.

LAD is a large vessel. There is a minimal (<24%) calcified plaque in
the proximal LAD. The mid LAD with several separate plaques: First
two are mild calcified plaques wit the third being a moderate
(50-69%) calcified plaque. The mid-distal LAD with a moderate
calcified plaque. D1 and D2 are small caliber vessels with no
plaque. D3 is a medium sized vessel with minimal mid vessel
calcification.

LCX is a non-dominant artery that gives rise to one large OM1
branch. There is no plaque.

Other findings:

Normal pulmonary vein drainage into the left atrium.

Normal left atrial appendage without a thrombus.

Normal size of the pulmonary artery.
IMPRESSION: 1. Coronary calcium score of 9595. This was 0 percentile for age and
sex matched control.

2. Normal coronary origin with right dominance.

3. Moderate Coronary artery disease. CADRADS 3. This study will be
send for FFRct.

Blain Jumper, DO

ADDENDUM:
The percentile for coronary calcium score was not calculated in the
previous addendum. Please see below.

1. Coronary calcium score of 9595. This was 73 percentile for age
and sex matched control.

*** End of Addendum ***
Addendum:
EXAM:
OVER-READ INTERPRETATION  CT CHEST

The following report is an over-read performed by radiologist Dr.
Nerfi Ruz [REDACTED] on 01/21/2020. This
over-read does not include interpretation of cardiac or coronary
anatomy or pathology. The coronary calcium score/coronary CTA
interpretation by the cardiologist is attached.
FINDINGS: Scattered areas of scarring and architectural distortion in the
lungs bilaterally, likely sequela of prior infection. Some
ground-glass attenuation also noted in the lower lobes of the lungs
bilaterally. 4 mm left lower lobe pulmonary nodule (axial image 31
of series 12). Within the visualized portions of the thorax there
are no other larger more suspicious appearing pulmonary nodules or
masses, there is no acute consolidative airspace disease, no pleural
effusions, no pneumothorax and no lymphadenopathy. Visualized
portions of the upper abdomen are unremarkable. There are no
aggressive appearing lytic or blastic lesions noted in the
visualized portions of the skeleton.
IMPRESSION: 1. Bilateral lower lobe ground-glass attenuation. This is
nonspecific, and no prior studies are available for comparison.
Clinical correlation for signs and symptoms of viral infection is
suggested.
2. 4 mm left lower lobe pulmonary nodule (axial image 31 of series
12), nonspecific, but statistically likely benign. No follow-up
needed if patient is low-risk. Non-contrast chest CT can be
considered in 12 months if patient is high-risk. This recommendation
follows the consensus statement: Guidelines for Management of
Incidental Pulmonary Nodules Detected on CT Images: From the
FINDINGS: A 120 kV prospective scan was triggered in the descending thoracic
aorta at 111 HU's. Axial non-contrast 3 mm slices were carried out
through the heart. The data set was analyzed on a dedicated work
station and scored using the Agatson method. Gantry rotation speed
was 250 msecs and collimation was .6 mm. No beta blockade and 0.8 mg
of sl NTG was given. The 3D data set was reconstructed in 5%
intervals of the 67-82 % of the R-R cycle. Diastolic phases were
analyzed on a dedicated work station using MPR, MIP and VRT modes.
The patient received 80 cc of contrast.

Aorta: Normal size.  No calcifications.  No dissection.

Aortic Valve:  Trileaflet.  No calcifications.

Coronary calcium score: 9595

Coronary Arteries:  Normal coronary origin.  Right dominance.

RCA is a large dominant artery that gives rise to PDA and PLVB.
There is a mild (25-49%) calcified plaque in the proximal RCA. The
proximal to mid RCA with minimal soft plaque. In the distal RCA
there is a mild long (24 mm) calcified plaque.

Left main is a large artery that gives rise to LAD, Intermedius
Ramus and LCX arteries.

Ramus Intermedius - there are no plaques.

LAD is a large vessel. There is a minimal (<24%) calcified plaque in
the proximal LAD. The mid LAD with several separate plaques: First
two are mild calcified plaques wit the third being a moderate
(50-69%) calcified plaque. The mid-distal LAD with a moderate
calcified plaque. D1 and D2 are small caliber vessels with no
plaque. D3 is a medium sized vessel with minimal mid vessel
calcification.

LCX is a non-dominant artery that gives rise to one large OM1
branch. There is no plaque.

Other findings:

Normal pulmonary vein drainage into the left atrium.

Normal left atrial appendage without a thrombus.

Normal size of the pulmonary artery.
IMPRESSION: 1. Coronary calcium score of 9595. This was 0 percentile for age and
sex matched control.

2. Normal coronary origin with right dominance.

3. Moderate Coronary artery disease. CADRADS 3. This study will be
send for FFRct.

Blain Jumper, DO

*** End of Addendum ***
EXAM:
OVER-READ INTERPRETATION  CT CHEST

The following report is an over-read performed by radiologist Dr.
Nerfi Ruz [REDACTED] on 01/21/2020. This
over-read does not include interpretation of cardiac or coronary
anatomy or pathology. The coronary calcium score/coronary CTA
interpretation by the cardiologist is attached.
FINDINGS: Scattered areas of scarring and architectural distortion in the
lungs bilaterally, likely sequela of prior infection. Some
ground-glass attenuation also noted in the lower lobes of the lungs
bilaterally. 4 mm left lower lobe pulmonary nodule (axial image 31
of series 12). Within the visualized portions of the thorax there
are no other larger more suspicious appearing pulmonary nodules or
masses, there is no acute consolidative airspace disease, no pleural
effusions, no pneumothorax and no lymphadenopathy. Visualized
portions of the upper abdomen are unremarkable. There are no
aggressive appearing lytic or blastic lesions noted in the
visualized portions of the skeleton.
IMPRESSION: 1. Bilateral lower lobe ground-glass attenuation. This is
nonspecific, and no prior studies are available for comparison.
Clinical correlation for signs and symptoms of viral infection is
suggested.
2. 4 mm left lower lobe pulmonary nodule (axial image 31 of series
12), nonspecific, but statistically likely benign. No follow-up
needed if patient is low-risk. Non-contrast chest CT can be
considered in 12 months if patient is high-risk. This recommendation
follows the consensus statement: Guidelines for Management of
Incidental Pulmonary Nodules Detected on CT Images: From the
